# Patient Record
Sex: Female | Born: 1975 | Race: White | Hispanic: No | Marital: Single | Smoking: Never smoker
Health system: Southern US, Community
[De-identification: ages and names within clinical notes are randomized; demographics above are authoritative.]

## PROBLEM LIST (undated history)

## (undated) DIAGNOSIS — R51 Headache: Secondary | ICD-10-CM

## (undated) DIAGNOSIS — T7840XA Allergy, unspecified, initial encounter: Secondary | ICD-10-CM

## (undated) DIAGNOSIS — F419 Anxiety disorder, unspecified: Secondary | ICD-10-CM

## (undated) DIAGNOSIS — R519 Headache, unspecified: Secondary | ICD-10-CM

## (undated) DIAGNOSIS — G8929 Other chronic pain: Secondary | ICD-10-CM

## (undated) DIAGNOSIS — J45909 Unspecified asthma, uncomplicated: Secondary | ICD-10-CM

## (undated) DIAGNOSIS — I10 Essential (primary) hypertension: Secondary | ICD-10-CM

## (undated) DIAGNOSIS — R05 Cough: Secondary | ICD-10-CM

## (undated) HISTORY — DX: Other chronic pain: G89.29

## (undated) HISTORY — PX: LASIK: SHX215

## (undated) HISTORY — DX: Unspecified asthma, uncomplicated: J45.909

## (undated) HISTORY — PX: OTHER SURGICAL HISTORY: SHX169

## (undated) HISTORY — DX: Cough: R05

## (undated) HISTORY — PX: EYE SURGERY: SHX253

## (undated) HISTORY — DX: Allergy, unspecified, initial encounter: T78.40XA

## (undated) HISTORY — DX: Anxiety disorder, unspecified: F41.9

## (undated) HISTORY — DX: Headache, unspecified: R51.9

## (undated) HISTORY — DX: Headache: R51

## (undated) HISTORY — DX: Essential (primary) hypertension: I10

---

## 1997-12-04 HISTORY — PX: BREAST SURGERY: SHX581

## 2005-07-26 ENCOUNTER — Other Ambulatory Visit: Admission: RE | Admit: 2005-07-26 | Discharge: 2005-07-26 | Payer: Self-pay | Admitting: Obstetrics and Gynecology

## 2007-09-04 ENCOUNTER — Other Ambulatory Visit: Admission: RE | Admit: 2007-09-04 | Discharge: 2007-09-04 | Payer: Self-pay | Admitting: Gynecology

## 2008-06-19 ENCOUNTER — Inpatient Hospital Stay (HOSPITAL_COMMUNITY): Admission: AD | Admit: 2008-06-19 | Discharge: 2008-06-19 | Payer: Self-pay | Admitting: *Deleted

## 2008-06-22 ENCOUNTER — Inpatient Hospital Stay (HOSPITAL_COMMUNITY): Admission: AD | Admit: 2008-06-22 | Discharge: 2008-06-27 | Payer: Self-pay | Admitting: Obstetrics & Gynecology

## 2011-04-18 NOTE — H&P (Signed)
NAMETARAHJI, RAMTHUN NO.:  [id number]   MEDICAL RECORD NO.:  [id number]           PATIENT TYPE:   LOCATION:                                 FACILITY:   PHYSICIAN:  Lenoard Aden, M.D.     DATE OF BIRTH:   DATE OF ADMISSION:  DATE OF DISCHARGE:                              HISTORY & PHYSICAL   EMERGENCY ROOM CONSULTATION   CHIEF COMPLAINT:  Rule out preeclampsia.   She is a 35 year old white female, G1, P0 at 39+ weeks, who presents for  evaluation of elevated blood pressure and proteinuria in the office.  She denies headaches, visual symptoms, epigastric pain.  She reports  good fetal movement.  Denies contractions, bleeding, or ruptured  membranes.   MEDICATIONS:  Prenatal vitamins.   She has allergies to PENICILLIN.   FAMILY HISTORY:  Myocardial infarction, chronic hypertension.   PREGNANCY HISTORY:  Noncontributory.  Prenatal course complicated by  multiple uterine fibroids but no other problems.   PHYSICAL EXAMINATION:  She is a well-developed and well-nourished female  in no acute distress.  HEENT:  Normal.  LUNGS:  Clear.  HEART:  Regular rate and rhythm.  ABDOMEN:  Soft, gravid, nontender.  No right upper quadrant tenderness.  DTRs 2+.  No evidence of clonus.  Cervix long, closed, thick, and out at  the pelvis.   NST is reactive.  PIH panel, including CBC, CMP, uric acid, and LDH are  all within normal limits.   The ultrasound performed reveals an estimated fetal weight of greater in  the 80th percentile, an estimated amniotic fluid index in the 80th  percentile as well.  NST is reactive.  No contractions are noted.   IMPRESSION:  1. A 39-week obstetrics.  2. Stabilization of blood pressure with blood pressures now ranging      120s-130s/70s-80s with normal pregnancy-induced hypertension  panel      and reassuring ultrasound.   PLAN:  1. Discharge home.  2. Preeclamptic warnings given.  3. Patient to collect a 24-hour urine  sample over the weekend and      results to the office on Monday.      Lenoard Aden, M.D.  Electronically Signed     RJT/MEDQ  D:  06/19/2008  T:  06/19/2008  Job:  [id number]

## 2011-04-21 NOTE — Discharge Summary (Signed)
Tonya Holland, GIACOBBE       ACCOUNT NO.:  [id number]   MEDICAL RECORD NO.:  [id number]          PATIENT TYPE:  INP   LOCATION:  9138                          FACILITY:  [facility]   PHYSICIAN:  Genia Del, M.D.DATE OF BIRTH:  Jan 05, 1976   DATE OF ADMISSION:  06/22/2008  DATE OF DISCHARGE:  06/27/2008                               DISCHARGE SUMMARY   ADMISSION DIAGNOSES:  A 39 weeks' and 6 days' gestation with chronic  hypertension for induction.   DISCHARGE DIAGNOSES:  A 39 weeks' and 6 days' gestation with chronic  hypertension for induction.   HOSPITAL COURSE:  The patient was admitted, and Cervidil was used for  cervical ripening given an unfavorable cervix.  The patient had  spontaneous rupture of membranes that was prolonged.  She was therefore  started on IV antibiotics.  She progressed well in active labor, but had  insufficiency of expulsive forces in the second stage, so after more  than 2 hours of pushing, a low-vacuum extraction was done successfully.  She had a vaginal delivery, baby girl.  Apgars were 9 and 9.  No  complication from delivery.  Postpartum evaluation was normal.  The  patient was discharged in stable status on postpartum day #2.  Her  postpartum hemoglobin was 8.1 with a hematocrit of 24.3.  Postpartum  advice were given.  Iron supplements were advised.  She will follow up  with [facility] in 6 weeks.      Genia Del, M.D.  Electronically Signed     ML/MEDQ  D:  07/12/2008  T:  07/12/2008  Job:  [id number]

## 2011-09-01 LAB — CBC
HCT: 24.3 — ABNORMAL LOW
HCT: 37.5
HCT: 39
Hemoglobin: 12.5
MCV: 87.8
MCV: 87.9
MCV: 88.2
MCV: 89.5
Platelets: 190
Platelets: 253
RBC: 2.72 — ABNORMAL LOW
RBC: 4.26
RBC: 4.45
RDW: 16.1 — ABNORMAL HIGH
WBC: 11.8 — ABNORMAL HIGH
WBC: 12.2 — ABNORMAL HIGH
WBC: 13.5 — ABNORMAL HIGH
WBC: 16.4 — ABNORMAL HIGH

## 2011-09-01 LAB — URIC ACID
Uric Acid, Serum: 4.5
Uric Acid, Serum: 4.5

## 2011-09-01 LAB — COMPREHENSIVE METABOLIC PANEL
AST: 33
AST: 35
Albumin: 2.4 — ABNORMAL LOW
Albumin: 2.7 — ABNORMAL LOW
Alkaline Phosphatase: 131 — ABNORMAL HIGH
Alkaline Phosphatase: 152 — ABNORMAL HIGH
BUN: 5 — ABNORMAL LOW
BUN: 9
BUN: 9
CO2: 22
CO2: 23
Calcium: 9.4
Chloride: 103
Chloride: 105
Chloride: 105
Creatinine, Ser: 0.45
Creatinine, Ser: 0.46
Creatinine, Ser: 0.55
GFR calc Af Amer: >60
GFR calc Af Amer: >60
GFR calc non Af Amer: >60
GFR calc non Af Amer: >60
Potassium: 3.8
Potassium: 4
Total Bilirubin: 0.3
Total Bilirubin: 0.5
Total Bilirubin: 0.8
Total Protein: 6.5

## 2011-09-01 LAB — GLUCOSE, CAPILLARY
Glucose-Capillary: 105 — ABNORMAL HIGH
Glucose-Capillary: 128 — ABNORMAL HIGH
Glucose-Capillary: 89
Glucose-Capillary: 92

## 2011-12-02 ENCOUNTER — Ambulatory Visit (INDEPENDENT_AMBULATORY_CARE_PROVIDER_SITE_OTHER): Payer: BC Managed Care – PPO

## 2011-12-02 DIAGNOSIS — R059 Cough, unspecified: Secondary | ICD-10-CM

## 2011-12-02 DIAGNOSIS — R05 Cough: Secondary | ICD-10-CM

## 2011-12-02 DIAGNOSIS — J069 Acute upper respiratory infection, unspecified: Secondary | ICD-10-CM

## 2012-01-03 ENCOUNTER — Institutional Professional Consult (permissible substitution): Payer: Self-pay | Admitting: Internal Medicine

## 2012-01-11 ENCOUNTER — Ambulatory Visit (INDEPENDENT_AMBULATORY_CARE_PROVIDER_SITE_OTHER): Payer: BC Managed Care – PPO | Admitting: Internal Medicine

## 2012-01-11 ENCOUNTER — Encounter: Payer: Self-pay | Admitting: Internal Medicine

## 2012-01-11 DIAGNOSIS — I10 Essential (primary) hypertension: Secondary | ICD-10-CM

## 2012-01-11 DIAGNOSIS — R059 Cough, unspecified: Secondary | ICD-10-CM

## 2012-01-11 DIAGNOSIS — R05 Cough: Secondary | ICD-10-CM | POA: Insufficient documentation

## 2012-01-11 HISTORY — DX: Cough, unspecified: R05.9

## 2012-01-11 MED ORDER — OLMESARTAN MEDOXOMIL 20 MG PO TABS: ORAL_TABLET | ORAL | Status: DC

## 2012-01-11 NOTE — Assessment & Plan Note (Signed)
The most common causes of chronic cough in immunocompetent adults include the following: upper airway cough syndrome (UACS), previously referred to as postnasal drip syndrome (PNDS), which is caused by variety of rhinosinus conditions; (2) asthma; (3) GERD; (4) chronic bronchitis from cigarette smoking or other inhaled environmental irritants; (5) nonasthmatic eosinophilic bronchitis; and (6) bronchiectasis.   These conditions, singly or in combination, have accounted for up to 94% of the causes of chronic cough in prospective studies.   Other conditions have constituted no >6% of the causes in prospective studies These have included bronchogenic carcinoma, chronic interstitial pneumonia, sarcoidosis, left ventricular failure, ACEI-induced cough, and aspiration from a condition associated with pharyngeal dysfunction.   This cough is most c/w  Classic Upper airway cough syndrome, so named because it's frequently impossible to sort out how much is  CR/sinusitis with freq throat clearing (which can be related to primary GERD)   vs  causing  secondary (" extra esophageal")  GERD from wide swings in gastric pressure that occur with throat clearing, often  promoting self use of mint and menthol lozenges that reduce the lower esophageal sphincter tone and exacerbate the problem further in a cyclical fashion.   These are the same pts who not infrequently have failed to tolerate ace inhibitors,  dry powder inhalers or biphosphonates or report having reflux symptoms that don't respond to standard doses of PPI , and are easily confused as having aecopd or asthma flares,   She is on ACE inhibitors so the lowest hanging fruit is to stop this then look at other issues like cough variant asthma and sinus dz/ gerd etc.

## 2012-01-11 NOTE — Patient Instructions (Addendum)
GERD (REFLUX)  is an extremely common cause of respiratory symptoms, many times with no significant heartburn at all, and may be triggering some of your symptoms while on lotensin since reflux can set off upper airway inflammation   It can be treated with medication, but also with lifestyle changes including avoidance of late meals, excessive alcohol, smoking cessation, and avoid fatty foods, chocolate, peppermint, colas, red wine, and acidic juices such as orange juice.  NO MINT OR MENTHOL PRODUCTS SO NO COUGH DROPS  USE SUGARLESS CANDY INSTEAD (jolley ranchers or Stover's)  NO OIL BASED VITAMINS - use powdered substitutes.   Stop lotensin  Start benicar 20 mg one half each am in place of lotensin  See Dr  Hal Hope in 6 weeks if better and she can manage your blood pressure - if not better please return at that point to see me

## 2012-01-11 NOTE — Assessment & Plan Note (Signed)
ACE inhibitors are problematic in  pts with airway complaints because  even experienced pulmonologists can't always distinguish ace effects from copd/asthma/pnds/ allergies etc.  By themselves they don't actually cause a problem, much like oxygen can't by itself start a fire, but they certainly serve as a powerful catalyst or enhancer for any "fire"  or inflammatory process in the upper airway, be it caused by an ET  tube or more commonly reflux (especially in the obese or pts with known GERD or who are on biphoshonates) or URI's, due to interference with bradykinin clearance.  The effects of acei on bradykinin levels occurs in 100% of pt's on acei (unless they surreptitiously stop the med!) but the classic cough is only reported in 5%.  This leaves 95% of pts on acei's  with a variety of syndromes including no identifiable symptom in most  vs non-specific symptoms in many that wax and wane depending on what other insult is occuring at the level of the upper airway.     The only way to establish cause and effect here is a minimum of 4 week trial off ace and consider substituting longterm arb if tolerated .  For now samples given of benicar 20 mg one half daily > See instructions for specific recommendations which were reviewed directly with the patient who was given a copy with highlighter outlining the key components.

## 2012-01-11 NOTE — Progress Notes (Signed)
  Subjective:    Patient ID: Tonya Holland, female    DOB: 08/04/1976  MRN: [id number]  HPI  Brief patient profile:  36 yowf never smoker dx'd with asthma as child outgrew in elementary school but continued to have some seasonal rhinitis with recurrent bronchitis since around 2010 while on ACE inhibitors self referred 01/11/2012 to pulmonary clinic.  HPI 01/11/2012  1st pulmonary eval on ACEI  X 3 years c/o increase in severity and  Frequency  Of exac of bronchitis with constant sensation of pnds in between flarees  despite clariton daily. No change in exposures at home or work. Better with rx with prednisone best. No sob between exac and presently not using saba much at all during the day. Her main concern is preventing future severe exac of cough.  Presently Sleeping ok without nocturnal  or early am exacerbation  of respiratory  c/o's or need for noct saba. Also denies any obvious fluctuation of symptoms with weather or environmental changes or other aggravating or alleviating factors except as outlined above        Review of Systems  Constitutional: Negative for fever and unexpected weight change.  HENT: Positive for congestion. Negative for ear pain, nosebleeds, sore throat, rhinorrhea, sneezing, trouble swallowing, dental problem, postnasal drip and sinus pressure.   Eyes: Negative for redness and itching.  Respiratory: Positive for cough. Negative for chest tightness, shortness of breath and wheezing.   Cardiovascular: Negative for palpitations and leg swelling.  Gastrointestinal: Negative for nausea and vomiting.  Genitourinary: Negative for dysuria.  Musculoskeletal: Negative for joint swelling.  Skin: Negative for rash.  Neurological: Negative for headaches.  Hematological: Does not bruise/bleed easily.  Psychiatric/Behavioral: Negative for dysphoric mood. The patient is nervous/anxious.        Objective:   Physical Exam  amb wf nad with pseudowheeze  amb wf 219   01/11/2012   HEENT: nl dentition, turbinates, and orophanx. Nl external ear canals without cough reflex   NECK :  without JVD/Nodes/TM/ nl carotid upstrokes bilaterally   LUNGS: no acc muscle use, clear to A and P bilaterally without cough on insp or exp maneuvers   CV:  RRR  no s3 or murmur or increase in P2, no edema   ABD:  soft and nontender with nl excursion in the supine position. No bruits or organomegaly, bowel sounds nl  MS:  warm without deformities, calf tenderness, cyanosis or clubbing  SKIN: warm and dry without lesions    NEURO:  alert, approp, no deficits        Assessment & Plan:

## 2012-02-19 ENCOUNTER — Telehealth: Payer: Self-pay | Admitting: Internal Medicine

## 2012-02-19 MED ORDER — CITALOPRAM HYDROBROMIDE 10 MG PO TABS: 10.0000 mg | ORAL_TABLET | Freq: Every day | ORAL | Status: DC

## 2012-02-19 NOTE — Telephone Encounter (Signed)
Celexa RF #[phone]

## 2012-02-26 ENCOUNTER — Encounter: Payer: Self-pay | Admitting: Family Medicine

## 2012-02-26 ENCOUNTER — Ambulatory Visit (INDEPENDENT_AMBULATORY_CARE_PROVIDER_SITE_OTHER): Payer: BC Managed Care – PPO | Admitting: Family Medicine

## 2012-02-26 VITALS — BP 136/89 | HR 88 | Temp 97.3°F | Resp 16 | Ht 68.0 in | Wt 206.0 lb

## 2012-02-26 DIAGNOSIS — I10 Essential (primary) hypertension: Secondary | ICD-10-CM

## 2012-02-26 DIAGNOSIS — F419 Anxiety disorder, unspecified: Secondary | ICD-10-CM

## 2012-02-26 NOTE — Progress Notes (Signed)
  Subjective:    Patient ID: Tonya Holland, female    DOB: [birth year], 36 y.o.   MRN: [id number]  HPI Patient presents for routine follow up. Urgent visits secondary to cough followed by pulmonary evaluation. Ace inhibitor discontinued with resolution of cough. Currently on Benicar without side effects..  Needs refills of Celexa and Xanax; states anxiety symptoms controlled on current regimen. Denies stressors.  Review of Systems     Objective:   Physical Exam  Constitutional: She appears well-developed.  HENT:  Nose: Nose normal.  Mouth/Throat: Oropharynx is clear and moist.  Neck: Neck supple.  Cardiovascular: Normal rate, regular rhythm and normal heart sounds.   Pulmonary/Chest: Effort normal and breath sounds normal.  Neurological: She is alert.        Assessment & Plan:   1. Anxiety  citalopram (CELEXA) 10 MG tablet, ALPRAZolam (XANAX) 0.5 MG tablet  2. HTN (hypertension)  Benicar 20mg  daily    Follow up 6 months

## 2012-02-28 ENCOUNTER — Telehealth: Payer: Self-pay

## 2012-02-28 NOTE — Telephone Encounter (Signed)
Pt is requesting med refill on selexia, benicar , xanax and naproxine [facility] [address]

## 2012-02-29 ENCOUNTER — Telehealth: Payer: Self-pay

## 2012-02-29 MED ORDER — CITALOPRAM HYDROBROMIDE 10 MG PO TABS: 10.0000 mg | ORAL_TABLET | Freq: Every day | ORAL | Status: DC

## 2012-02-29 MED ORDER — ALPRAZOLAM 0.5 MG PO TABS: 0.5000 mg | ORAL_TABLET | Freq: Every evening | ORAL | Status: DC | PRN

## 2012-02-29 NOTE — Telephone Encounter (Signed)
This patient was seen on 02/26/12. Needs refills on meds. Please advise.

## 2012-02-29 NOTE — Telephone Encounter (Signed)
Pharmacist would like a refill for Naproxen on file for pt. Can we refill? Please advise

## 2012-02-29 NOTE — Telephone Encounter (Signed)
Patient needs to call her pharmacy to request all refills.  Tonya Holland

## 2012-03-01 ENCOUNTER — Telehealth: Payer: Self-pay

## 2012-03-01 DIAGNOSIS — I10 Essential (primary) hypertension: Secondary | ICD-10-CM

## 2012-03-01 DIAGNOSIS — F411 Generalized anxiety disorder: Secondary | ICD-10-CM | POA: Insufficient documentation

## 2012-03-01 MED ORDER — LOSARTAN POTASSIUM 50 MG PO TABS: 50.0000 mg | ORAL_TABLET | Freq: Every day | ORAL | Status: DC

## 2012-03-01 MED ORDER — NAPROXEN 500 MG PO TABS: 500.0000 mg | ORAL_TABLET | ORAL | Status: DC | PRN

## 2012-03-01 NOTE — Telephone Encounter (Signed)
Losartan sent.

## 2012-03-01 NOTE — Telephone Encounter (Signed)
Spoke with pharmacist yesterday and I called in refills for pt. He just wanted RX on file for Naproxen.

## 2012-03-01 NOTE — Telephone Encounter (Deleted)
Patient called and is having problems getting her Benicar due to insurance prior authorization. Patient is frustrated because she has been trying to get this resolved Monday since her office.  After discussion we came up with a plan that we would continue with the prior auth and call in 30 days of Losartan 50mg  in the meantime. Patient was advised that Losartan is the generic Benicar and that insurance companies will not typically pay for name brand until the generic has failed. Patient is willing to try the generic and if the generic works she will stay with it. Will proceed with the insurance paper work.  Please send Losartan 50mg  to [facility]

## 2012-03-01 NOTE — Telephone Encounter (Signed)
Already done.  See phone message from the patient.

## 2012-03-01 NOTE — Telephone Encounter (Signed)
Patient called to stated that she has been trying to get her Benicar prior auth approval taken care of since her office visit since Monday. Spoke with pharmacy and requested they resend paperwork for prior auth.  Advised patient that Benicar may not be approved because the generic (Losartan) would need to be tried and failed. Patient agreed on plan to try Losartan 50 mg for 30 days and still try to get Benicar approved.  Please send Losartan 50 mg to [facility].

## 2012-03-01 NOTE — Telephone Encounter (Signed)
Please advise patient that her prescriptions for celexa, benicar and alprazolam all have refills on them and she just needs to notify the pharmacist.  I have sent a refill of the naproxen to the pharmacy.

## 2012-03-08 ENCOUNTER — Telehealth: Payer: Self-pay

## 2012-03-08 NOTE — Telephone Encounter (Signed)
Dr Hal Hope for your information please see previous communication concerning medication. Patient returned call and will follow up with Dr Hal Hope in 1 month to see if blood pressure is at good levels on losartan 50 mg. If that is the case and Dr Hal Hope agrees she will stay on losartan.

## 2012-03-08 NOTE — Telephone Encounter (Signed)
[facility], they returned my call and stated that the Benicar does not need a prior auth it just has a high deductible of $104 per 30 day supply. Patient needs to be notified.

## 2012-03-08 NOTE — Telephone Encounter (Signed)
Left message on voicemail for return call.

## 2012-03-29 ENCOUNTER — Other Ambulatory Visit: Payer: Self-pay | Admitting: Physician Assistant

## 2012-04-08 ENCOUNTER — Ambulatory Visit (INDEPENDENT_AMBULATORY_CARE_PROVIDER_SITE_OTHER): Payer: BC Managed Care – PPO | Admitting: Family Medicine

## 2012-04-08 ENCOUNTER — Encounter: Payer: Self-pay | Admitting: Family Medicine

## 2012-04-08 VITALS — BP 128/78 | HR 91 | Temp 98.4°F | Resp 16 | Ht 68.5 in | Wt 197.4 lb

## 2012-04-08 DIAGNOSIS — F411 Generalized anxiety disorder: Secondary | ICD-10-CM

## 2012-04-08 DIAGNOSIS — F419 Anxiety disorder, unspecified: Secondary | ICD-10-CM

## 2012-04-08 DIAGNOSIS — I10 Essential (primary) hypertension: Secondary | ICD-10-CM

## 2012-04-08 MED ORDER — CITALOPRAM HYDROBROMIDE 10 MG PO TABS: 10.0000 mg | ORAL_TABLET | Freq: Every day | ORAL | Status: DC

## 2012-04-08 MED ORDER — LOSARTAN POTASSIUM 50 MG PO TABS: 50.0000 mg | ORAL_TABLET | Freq: Every day | ORAL | Status: DC

## 2012-04-08 NOTE — Progress Notes (Signed)
  Subjective:    Patient ID: Tonya Holland, female    DOB: Feb 08, 1976, 36 y.o.   MRN: [id number]  HPI Patient presents for recheck of blood pressure. Insurance would not continue to cover Benicar so patient switched to Cozaar  Occasional BP 140/90  Review of Systems     Objective:   Physical Exam  Nursing note and vitals reviewed. Constitutional: She appears well-developed.  Cardiovascular: Normal rate, regular rhythm and normal heart sounds.   Pulmonary/Chest: Effort normal and breath sounds normal.  Neurological: She is alert.        Assessment & Plan:  HTN  Follow BP readings.  Patient to call is BP's are consistently 140/ 90 or greater and we'll adjust dose

## 2012-04-10 ENCOUNTER — Encounter: Payer: Self-pay | Admitting: Family Medicine

## 2012-04-22 ENCOUNTER — Ambulatory Visit: Payer: BC Managed Care – PPO | Admitting: Family Medicine

## 2012-04-30 ENCOUNTER — Other Ambulatory Visit: Payer: Self-pay | Admitting: Physician Assistant

## 2012-06-04 ENCOUNTER — Encounter: Payer: Self-pay | Admitting: Family Medicine

## 2012-06-04 ENCOUNTER — Ambulatory Visit (INDEPENDENT_AMBULATORY_CARE_PROVIDER_SITE_OTHER): Payer: BC Managed Care – PPO | Admitting: Family Medicine

## 2012-06-04 VITALS — BP 128/81 | HR 83 | Temp 97.9°F | Resp 18 | Ht 68.5 in | Wt 184.2 lb

## 2012-06-04 DIAGNOSIS — I10 Essential (primary) hypertension: Secondary | ICD-10-CM

## 2012-06-04 DIAGNOSIS — F419 Anxiety disorder, unspecified: Secondary | ICD-10-CM

## 2012-06-04 MED ORDER — FLUTICASONE PROPIONATE 50 MCG/ACT NA SUSP: 2.0000 | NASAL | Status: DC | PRN

## 2012-06-04 MED ORDER — ALPRAZOLAM 0.5 MG PO TABS: ORAL_TABLET | ORAL | Status: DC

## 2012-06-04 MED ORDER — NAPROXEN 500 MG PO TABS: 500.0000 mg | ORAL_TABLET | ORAL | Status: DC | PRN

## 2012-06-04 NOTE — Progress Notes (Signed)
S: This 36 y.o. Cauc female is here for HTN follow-up and medication refills. She has had no problems since BP med was changed to Losartan. Denies HA (except for occasional migraine treated with Naprosyn), CP, SOB, cough, dizziness,       edema, lightheadedness, syncope, or weakness.  She takes Alprazolam prn anxiety (does not need it daily). She is doing well on Citalopram.  O: Vital signs reviewed      HEENT: Normal      COR: Regular rate and rhythm      LUNGS: Normal resp rate and effort      NEURO: Nonfocal   A: HTN- well controlled and stable on current medication   P: Refill needed medications and schedule CPE (PAP done by GYN provider); labs to be drawn at next visit

## 2012-07-19 ENCOUNTER — Ambulatory Visit (INDEPENDENT_AMBULATORY_CARE_PROVIDER_SITE_OTHER): Payer: BC Managed Care – PPO | Admitting: Family Medicine

## 2012-07-19 VITALS — BP 130/84 | HR 84 | Temp 98.1°F | Resp 16 | Ht 68.5 in | Wt 176.0 lb

## 2012-07-19 DIAGNOSIS — R350 Frequency of micturition: Secondary | ICD-10-CM

## 2012-07-19 LAB — POCT URINALYSIS DIPSTICK
Bilirubin, UA: NEGATIVE
Glucose, UA: NEGATIVE
Ketones, UA: NEGATIVE
Nitrite, UA: POSITIVE
Protein, UA: 100
Spec Grav, UA: >=1.03
Urobilinogen, UA: 0.2
pH, UA: 6

## 2012-07-19 LAB — POCT UA - MICROSCOPIC ONLY
Casts, Ur, LPF, POC: NEGATIVE
Crystals, Ur, HPF, POC: NEGATIVE
Mucus, UA: NEGATIVE
Yeast, UA: NEGATIVE

## 2012-07-19 MED ORDER — FLUCONAZOLE 150 MG PO TABS: 150.0000 mg | ORAL_TABLET | Freq: Once | ORAL | Status: AC

## 2012-07-19 MED ORDER — NITROFURANTOIN MONOHYD MACRO 100 MG PO CAPS: 100.0000 mg | ORAL_CAPSULE | Freq: Two times a day (BID) | ORAL | Status: AC

## 2012-07-19 NOTE — Progress Notes (Signed)
[facility] [address], [place] [address] [phone]  Date:  07/19/2012   Name:  Tonya Holland   DOB:  [birth year]   MRN:  [id number]  PCP:  Dois Davenport., MD    Chief Complaint: Urinary Frequency   History of Present Illness:  Tonya Holland is a 36 y.o. very pleasant female patient who presents with the following:  She noted onset of dysuria, urgency yesterday.   She is not sure if she has had hematuria.  No fever or vomiting.  She states there is no risk of pregnancy at this time. No vaginal symptoms.  These are typical UTI symptoms for her.    Patient Active Problem List  Diagnosis  . Cough  . Hypertension  . Anxiety    Past Medical History  Diagnosis Date  . Hypertension   . Chronic headache     Past Surgical History  Procedure Date  . Breast tumor removed at age 74    right  . Lasik at age 89     History  Substance Use Topics  . Smoking status: Never Smoker   . Smokeless tobacco: Not on file  . Alcohol Use: Yes     occasionally wine    Family History  Problem Relation Age of Onset  . Allergies Mother   . Asthma Mother   . Breast cancer Mother     Allergies  Allergen Reactions  . Ace Inhibitors   . Penicillins     childhood    Medication list has been reviewed and updated.  Current Outpatient Prescriptions on File Prior to Visit  Medication Sig Dispense Refill  . ALPRAZolam (XANAX) 0.5 MG tablet Take 1/2 - 1 tablet twice a day as needed.  45 tablet  5  . cetirizine (ZYRTEC) 10 MG tablet Take 10 mg by mouth as needed.      . citalopram (CELEXA) 10 MG tablet Take 1 tablet (10 mg total) by mouth daily.  90 tablet  2  . fluticasone (FLONASE) 50 MCG/ACT nasal spray Place 2 sprays into the nose as needed.  16 g  5  . losartan (COZAAR) 50 MG tablet Take 1 tablet (50 mg total) by mouth daily.  90 tablet  2  . Melatonin-Pyridoxine (MELATIN PO) Take by mouth daily.      . naproxen (NAPROSYN) 500 MG  tablet Take 1 tablet (500 mg total) by mouth as needed.  60 tablet  2  . phentermine 37.5 MG capsule Take 37.5 mg by mouth daily.      . vitamin C (ASCORBIC ACID) 500 MG tablet Take 500 mg by mouth as needed.        Review of Systems:  As per HPI- otherwise negative.   Physical Examination: Filed Vitals:   07/19/12 1613  BP: 130/84  Pulse: 84  Temp: 98.1 F (36.7 C)  Resp: 16   Filed Vitals:   07/19/12 1613  Height: 5' 8.5" (1.74 m)  Weight: 176 lb (79.833 kg)   Body mass index is 26.37 kg/(m^2). Ideal Body Weight: Weight in (lb) to have BMI = 25: 166.5   GEN: WDWN, NAD, Non-toxic, A & O x 3 HEENT: Atraumatic, Normocephalic. Neck supple. No masses, No LAD. Ears and Nose: No external deformity. CV: RRR, No M/G/R. No JVD. No thrill. No extra heart sounds. PULM: CTA B, no wheezes, crackles, rhonchi. No retractions. No resp. distress. No accessory muscle use. ABD: S, NT, ND, +BS. No rebound. No  HSM. No CVA tenderness EXTR: No c/c/e NEURO Normal gait.  PSYCH: Normally interactive. Conversant. Not depressed or anxious appearing.  Calm demeanor.   Results for orders placed in visit on 07/19/12  POCT URINALYSIS DIPSTICK      Component Value Range   Color, UA yellow     Clarity, UA turbid     Glucose, UA neg     Bilirubin, UA neg     Ketones, UA neg     Spec Grav, UA >=1.030     Blood, UA large     pH, UA 6.0     Protein, UA 100 mg/dL     Urobilinogen, UA 0.2     Nitrite, UA positive     Leukocytes, UA large (3+)    POCT UA - MICROSCOPIC ONLY      Component Value Range   WBC, Ur, HPF, POC tntc     RBC, urine, microscopic 0-1     Bacteria, U Microscopic 4++     Mucus, UA neg     Epithelial cells, urine per micros 3-6     Crystals, Ur, HPF, POC neg     Casts, Ur, LPF, POC neg     Yeast, UA neg       Assessment and Plan: 1. Urinary frequency  POCT urinalysis dipstick, POCT UA - Microscopic Only, nitrofurantoin, macrocrystal-monohydrate, (MACROBID) 100 MG capsule,  fluconazole (DIFLUCAN) 150 MG tablet, Urine culture   Treat as above for UTI.  Patient (or parent if minor) instructed to return to clinic or call if not better in 2 day(s). May use diflucan if yeast vagintis occurs, but should not as she is taking macrobid.   Abbe Amsterdam, MD

## 2012-07-22 LAB — URINE CULTURE: Colony Count: >=100000

## 2012-08-29 ENCOUNTER — Other Ambulatory Visit: Payer: Self-pay | Admitting: Radiology

## 2012-08-29 DIAGNOSIS — F419 Anxiety disorder, unspecified: Secondary | ICD-10-CM

## 2012-08-29 MED ORDER — ALPRAZOLAM 0.5 MG PO TABS: ORAL_TABLET | ORAL | Status: DC

## 2012-09-17 ENCOUNTER — Encounter: Payer: BC Managed Care – PPO | Admitting: Family Medicine

## 2012-12-04 ENCOUNTER — Ambulatory Visit: Payer: BC Managed Care – PPO | Admitting: Family Medicine

## 2012-12-04 VITALS — BP 110/72 | HR 88 | Temp 97.6°F | Resp 16 | Ht 68.5 in | Wt 157.8 lb

## 2012-12-04 DIAGNOSIS — R05 Cough: Secondary | ICD-10-CM

## 2012-12-04 DIAGNOSIS — R509 Fever, unspecified: Secondary | ICD-10-CM

## 2012-12-04 LAB — POCT INFLUENZA A/B
Influenza A, POC: NEGATIVE
Influenza B, POC: NEGATIVE

## 2012-12-04 MED ORDER — DOXYCYCLINE HYCLATE 100 MG PO TABS: 100.0000 mg | ORAL_TABLET | Freq: Two times a day (BID) | ORAL | Status: DC

## 2012-12-04 NOTE — Progress Notes (Signed)
[facility] [address] [place] [address] [phone]  Date:  12/04/2012   Name:  Tonya Holland   DOB:  13-Jun-1976   MRN:  [id number]  PCP:  Dois Davenport., MD    Chief Complaint: Fever, Cough and Sinusitis   History of Present Illness:  Tonya Holland is a 37 y.o. very pleasant female patient who presents with the following:  She is here today with illness- she has noted a fever since 11/28/12.  She has been blowing out thick discolored mucus, also coughing a lot at night- can be productive.  She is using mucinex BID, sudafed and robitussin.  No particular ST or otits She is also using tylneol as needed, her temp has gotten up to 101- it was this high again this am.  She is having body aches.  She does feel tired and is having chills.   She is otherwise generally healthy except to HTN and anxiety- BP well controlled LMP was last week  Patient Active Problem List  Diagnosis  . Cough  . Hypertension  . Anxiety    Past Medical History  Diagnosis Date  . Hypertension   . Chronic headache     Past Surgical History  Procedure Date  . Breast tumor removed at age 51    right  . Lasik at age 63     History  Substance Use Topics  . Smoking status: Never Smoker   . Smokeless tobacco: Not on file  . Alcohol Use: Yes     Comment: occasionally wine    Family History  Problem Relation Age of Onset  . Allergies Mother   . Asthma Mother   . Breast cancer Mother     Allergies  Allergen Reactions  . Ace Inhibitors   . Penicillins     childhood    Medication list has been reviewed and updated.  Current Outpatient Prescriptions on File Prior to Visit  Medication Sig Dispense Refill  . ALPRAZolam (XANAX) 0.5 MG tablet Take one tablet at bedtime prn  45 tablet  0  . citalopram (CELEXA) 10 MG tablet Take 1 tablet (10 mg total) by mouth daily.  90 tablet  2  . fluticasone (FLONASE) 50 MCG/ACT nasal spray Place 2 sprays  into the nose as needed.  16 g  5  . losartan (COZAAR) 50 MG tablet Take 1 tablet (50 mg total) by mouth daily.  90 tablet  2  . Melatonin-Pyridoxine (MELATIN PO) Take by mouth daily.      . phentermine 37.5 MG capsule Take 37.5 mg by mouth daily.      . vitamin C (ASCORBIC ACID) 500 MG tablet Take 500 mg by mouth as needed.      . cetirizine (ZYRTEC) 10 MG tablet Take 10 mg by mouth as needed.      . naproxen (NAPROSYN) 500 MG tablet Take 1 tablet (500 mg total) by mouth as needed.  60 tablet  2    Review of Systems:  As per HPI- otherwise negative.   Physical Examination: Filed Vitals:   12/04/12 1303  BP: 110/72  Pulse: 88  Temp: 97.6 F (36.4 C)  Resp: 16   Filed Vitals:   12/04/12 1303  Height: 5' 8.5" (1.74 m)  Weight: 157 lb 12.8 oz (71.578 kg)   Body mass index is 23.64 kg/(m^2). Ideal Body Weight: Weight in (lb) to have BMI = 25: 166.5   GEN: WDWN, NAD, Non-toxic, A &  O x 3- looks well HEENT: Atraumatic, Normocephalic. Neck supple. No masses, No LAD. Bilateral TM wnl, oropharynx normal.  PEERL,EOMI.   Nasal congeston Ears and Nose: No external deformity. CV: RRR, No M/G/R. No JVD. No thrill. No extra heart sounds. PULM: CTA B, no wheezes, rhonchi. No retractions. No resp. distress. No accessory muscle use.  Mild crackles bilaterally ABD: S, NT, ND, +BS. No rebound. No HSM. EXTR: No c/c/e NEURO Normal gait.  PSYCH: Normally interactive. Conversant. Not depressed or anxious appearing.  Calm demeanor.    Assessment and Plan: 1. Fever  POCT Influenza A/B  2. Cough  doxycycline (VIBRA-TABS) 100 MG tablet   Treat for bronchitis and sinusitis as above. Continue OTC medications for symptoms as needed.  Let me know if not better in the next couple of days- Sooner if worse.     Abbe Amsterdam, MD

## 2012-12-04 NOTE — Patient Instructions (Addendum)
Use the antibiotic as directed.  Let me know if you are not getting better- Sooner if worse.

## 2012-12-11 ENCOUNTER — Encounter: Payer: Self-pay | Admitting: Family Medicine

## 2012-12-11 ENCOUNTER — Ambulatory Visit: Payer: BC Managed Care – PPO | Admitting: Family Medicine

## 2012-12-11 VITALS — BP 128/84 | HR 96 | Temp 98.4°F | Resp 16 | Ht 69.0 in | Wt 159.0 lb

## 2012-12-11 DIAGNOSIS — N898 Other specified noninflammatory disorders of vagina: Secondary | ICD-10-CM

## 2012-12-11 DIAGNOSIS — N920 Excessive and frequent menstruation with regular cycle: Secondary | ICD-10-CM

## 2012-12-11 LAB — POCT UA - MICROSCOPIC ONLY
Casts, Ur, LPF, POC: NEGATIVE
Yeast, UA: NEGATIVE

## 2012-12-11 LAB — POCT WET PREP WITH KOH
KOH Prep POC: NEGATIVE
Yeast Wet Prep HPF POC: NEGATIVE

## 2012-12-11 LAB — POCT URINALYSIS DIPSTICK
Blood, UA: NEGATIVE
Glucose, UA: NEGATIVE
Nitrite, UA: NEGATIVE
Urobilinogen, UA: 0.2

## 2012-12-11 MED ORDER — METRONIDAZOLE 500 MG PO TABS: 500.0000 mg | ORAL_TABLET | Freq: Two times a day (BID) | ORAL | Status: DC

## 2012-12-11 NOTE — Progress Notes (Signed)
[facility] [address], [place] [address] [phone]  Date:  12/11/2012   Name:  SRAH AKE   DOB:  [birth year]   MRN:  [id number]  PCP:  Dois Davenport., MD    Chief Complaint: Menstrual Problem   History of Present Illness:  KATERRA INGMAN is a 37 y.o. very pleasant female patient who presents with the following:  She was here on the 1st of the month with bronchitis. Treated with doxycycline.   She is here today with a menstrual problem.   She started her menses as expected on 11/27/12.  She has PCOS so her menses are not totally regular but are usually about every 4 weeks and usually last for 7 days .   She does not use OCP.  She has a female partner so there is no risk of pregnancy.  She did start to notice an odor a few days ago.  She does use tampons.  She is not sure if there could be a retained tampon.    No pain, she does not have a fever or any other sign of illness except she is still getting over her bronchitis.   No urinary symptoms.   She has not noted any vaginal discharge, no white material, no itching.    Patient Active Problem List  Diagnosis  . Cough  . Hypertension  . Anxiety    Past Medical History  Diagnosis Date  . Hypertension   . Chronic headache     Past Surgical History  Procedure Date  . Breast tumor removed at age 50    right  . Lasik at age 13     History  Substance Use Topics  . Smoking status: Never Smoker   . Smokeless tobacco: Not on file  . Alcohol Use: Yes     Comment: occasionally wine    Family History  Problem Relation Age of Onset  . Allergies Mother   . Asthma Mother   . Breast cancer Mother     Allergies  Allergen Reactions  . Ace Inhibitors   . Penicillins     childhood    Medication list has been reviewed and updated.  Current Outpatient Prescriptions on File Prior to Visit  Medication Sig Dispense Refill  . ALPRAZolam (XANAX) 0.5 MG tablet Take one  tablet at bedtime prn  45 tablet  0  . cetirizine (ZYRTEC) 10 MG tablet Take 10 mg by mouth as needed.      . citalopram (CELEXA) 10 MG tablet Take 1 tablet (10 mg total) by mouth daily.  90 tablet  2  . doxycycline (VIBRA-TABS) 100 MG tablet Take 1 tablet (100 mg total) by mouth 2 (two) times daily.  20 tablet  0  . fluticasone (FLONASE) 50 MCG/ACT nasal spray Place 2 sprays into the nose as needed.  16 g  5  . losartan (COZAAR) 50 MG tablet Take 1 tablet (50 mg total) by mouth daily.  90 tablet  2  . Melatonin-Pyridoxine (MELATIN PO) Take by mouth daily.      . naproxen (NAPROSYN) 500 MG tablet Take 1 tablet (500 mg total) by mouth as needed.  60 tablet  2  . phentermine 37.5 MG capsule Take 37.5 mg by mouth daily.      . vitamin C (ASCORBIC ACID) 500 MG tablet Take 500 mg by mouth as needed.        Review of Systems:  As per HPI- otherwise negative.  Physical Examination: Filed Vitals:   12/11/12 0812  BP: 128/84  Pulse: 96  Temp: 98.4 F (36.9 C)  Resp: 16   Filed Vitals:   12/11/12 0812  Height: 5\' 9"  (1.753 m)  Weight: 159 lb (72.122 kg)   Body mass index is 23.48 kg/(m^2). Ideal Body Weight: Weight in (lb) to have BMI = 25: 168.9   GEN: WDWN, NAD, Non-toxic, A & O x 3, looks well HEENT: Atraumatic, Normocephalic. Neck supple. No masses, No LAD. Ears and Nose: No external deformity. CV: RRR, No M/G/R. No JVD. No thrill. No extra heart sounds. PULM: CTA B, no wheezes, crackles, rhonchi. No retractions. No resp. distress. No accessory muscle use. ABD: S, NT, ND, +BS. No rebound. No HSM. EXTR: No c/c/e NEURO Normal gait.  PSYCH: Normally interactive. Conversant. Not depressed or anxious appearing.  Calm demeanor.  GU: no retained FB (tampon) in vagina, scant dark brown discharge but no active bleeding.  No adnexal tenderness or masses, no uterine enlargement or masses  Results for orders placed in visit on 12/11/12  POCT UA - MICROSCOPIC ONLY      Component Value  Range   WBC, Ur, HPF, POC 3-4     RBC, urine, microscopic 1-3     Bacteria, U Microscopic small     Mucus, UA small     Epithelial cells, urine per micros 5-8     Crystals, Ur, HPF, POC neg     Casts, Ur, LPF, POC neg     Yeast, UA neg    POCT URINALYSIS DIPSTICK      Component Value Range   Color, UA yellow     Clarity, UA hazy     Glucose, UA neg     Bilirubin, UA neg     Ketones, UA neg     Spec Grav, UA >=1.030     Blood, UA neg     pH, UA 6.5     Protein, UA neg     Urobilinogen, UA 0.2     Nitrite, UA neg     Leukocytes, UA Negative    POCT WET PREP WITH KOH      Component Value Range   Trichomonas, UA Negative     Clue Cells Wet Prep HPF POC 3-5     Epithelial Wet Prep HPF POC 2-4     Yeast Wet Prep HPF POC neg     Bacteria Wet Prep HPF POC 2+     RBC Wet Prep HPF POC 2-3     WBC Wet Prep HPF POC 8-12     KOH Prep POC Negative    POCT URINE PREGNANCY      Component Value Range   Preg Test, Ur Negative      Assessment and Plan: 1. Menorrhagia  POCT UA - Microscopic Only, POCT urinalysis dipstick, POCT Wet Prep with KOH, POCT urine pregnancy  2. Vaginal odor  metroNIDAZOLE (FLAGYL) 500 MG tablet   Suspect that Nicie has BV today.  Will treat with flagyl.  Explained that abnormal menstrual bleeding can occur due to stress or medication use.   It seems that her bleeding may be stopping now.  If she continues to note bleeding over the next couple of days we can try a course of provera or OCP.  Warned that if she continues to have this problem we will need her to see OB for possible endometrial sampling.  However, if her bleeding is soon under control and then returns  to normal further invasive evaluation should not be necessary.  She will keep me updated regarding her condition  COPLAND,JESSICA, MD

## 2012-12-11 NOTE — Patient Instructions (Addendum)
It looks like you have bacterial vaginosis.  Use the flagyl for this infection, but let me know if you bleeding is not resolved.  If you continue to have abnormal bleeding we will need to have you see OB- GYN

## 2012-12-12 ENCOUNTER — Telehealth: Payer: Self-pay | Admitting: *Deleted

## 2012-12-12 DIAGNOSIS — F419 Anxiety disorder, unspecified: Secondary | ICD-10-CM

## 2012-12-12 MED ORDER — CITALOPRAM HYDROBROMIDE 10 MG PO TABS: 10.0000 mg | ORAL_TABLET | Freq: Every day | ORAL | Status: DC

## 2012-12-12 NOTE — Telephone Encounter (Signed)
Celexa sent to pharmacy, pt needs OV/CPE

## 2012-12-12 NOTE — Telephone Encounter (Signed)
[facility]

## 2012-12-12 NOTE — Telephone Encounter (Signed)
LMOM we refilled her med.

## 2012-12-22 ENCOUNTER — Telehealth: Payer: Self-pay

## 2012-12-22 DIAGNOSIS — F419 Anxiety disorder, unspecified: Secondary | ICD-10-CM

## 2012-12-22 MED ORDER — ALPRAZOLAM 0.5 MG PO TABS: ORAL_TABLET | ORAL | Status: DC

## 2012-12-22 NOTE — Telephone Encounter (Signed)
Medication request (Alprazolam) called to [facility] on [address]

## 2012-12-22 NOTE — Telephone Encounter (Signed)
NEEDS REFILL ON Prudy Feeler  [phone]

## 2012-12-22 NOTE — Telephone Encounter (Signed)
FORGOT TO ASK WH/PHARMACY

## 2013-01-21 ENCOUNTER — Encounter: Payer: Self-pay | Admitting: Family Medicine

## 2013-01-21 ENCOUNTER — Ambulatory Visit (INDEPENDENT_AMBULATORY_CARE_PROVIDER_SITE_OTHER): Payer: BC Managed Care – PPO | Admitting: Family Medicine

## 2013-01-21 VITALS — BP 126/86 | HR 83 | Temp 99.3°F | Resp 16 | Ht 68.0 in | Wt 148.0 lb

## 2013-01-21 DIAGNOSIS — F411 Generalized anxiety disorder: Secondary | ICD-10-CM

## 2013-01-21 DIAGNOSIS — F419 Anxiety disorder, unspecified: Secondary | ICD-10-CM

## 2013-01-21 DIAGNOSIS — I10 Essential (primary) hypertension: Secondary | ICD-10-CM

## 2013-01-21 MED ORDER — CITALOPRAM HYDROBROMIDE 10 MG PO TABS: 10.0000 mg | ORAL_TABLET | Freq: Every day | ORAL | Status: DC

## 2013-01-21 MED ORDER — ALPRAZOLAM 0.5 MG PO TABS: ORAL_TABLET | ORAL | Status: DC

## 2013-01-21 MED ORDER — LOSARTAN POTASSIUM 50 MG PO TABS: 50.0000 mg | ORAL_TABLET | Freq: Every day | ORAL | Status: DC

## 2013-01-21 NOTE — Progress Notes (Signed)
S:  This 37 y.o. Cauc female is here for HTN follow-up and medication refills. She is well-controlled on Losartan and has no side effects (i.e. cough). She needs refills for Citalopram and Alprazolam which she takes for anxiety. 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  ROS: Negative for fatigue, appetite change, unexplained weight change, CP or tightness, palpitations, edema. SOB or DOE, HA, dizziness, numbness, weakness or syncope. She sleeps well and has no agitation, behavior change or dysphoric mood.  O:  Filed Vitals:   01/21/13 1640  BP: 126/86  Pulse: 83  Temp: 99.3 F (37.4 C)  Resp: 16   GEN: In NAD; WN,WD. HENT: Trainer/AT. EOMI w/ injected R conj (pt treated for "pink-eye" at another Urgent Care this AM). L conj clear. Otherwise normal. COR: RRR. LUNGS: Normal resp rate and effort. SKIN: W&D. No pallor or rashes. NEURO: A&O x 3; CNs intact. Nonfocal.  A/P: HTN (hypertension) - well- controlled and stable.  Plan: Continue Losartan 50 MG tablet  1 tab daily  Anxiety - stable on current medications.  Plan: Continue ALPRAZolam (XANAX) 0.5 MG tablet, citalopram (CELEXA) 10 MG tablet  Schedule CPE/PAP in April 2014.

## 2013-03-18 ENCOUNTER — Encounter: Payer: Self-pay | Admitting: Family Medicine

## 2013-05-13 ENCOUNTER — Other Ambulatory Visit: Payer: Self-pay | Admitting: Family Medicine

## 2013-05-13 NOTE — Telephone Encounter (Signed)
Alprazolam called to pt's pharmacy.

## 2013-05-15 ENCOUNTER — Encounter: Payer: Self-pay | Admitting: Family Medicine

## 2013-05-15 ENCOUNTER — Ambulatory Visit (INDEPENDENT_AMBULATORY_CARE_PROVIDER_SITE_OTHER): Payer: BC Managed Care – PPO | Admitting: Family Medicine

## 2013-05-15 ENCOUNTER — Other Ambulatory Visit: Payer: Self-pay | Admitting: Family Medicine

## 2013-05-15 VITALS — BP 116/68 | HR 83 | Temp 98.5°F | Resp 16 | Ht 68.0 in | Wt 138.2 lb

## 2013-05-15 DIAGNOSIS — D229 Melanocytic nevi, unspecified: Secondary | ICD-10-CM | POA: Insufficient documentation

## 2013-05-15 DIAGNOSIS — Z0000 Encounter for general adult medical examination without abnormal findings: Secondary | ICD-10-CM

## 2013-05-15 DIAGNOSIS — Z1231 Encounter for screening mammogram for malignant neoplasm of breast: Secondary | ICD-10-CM

## 2013-05-15 DIAGNOSIS — Z124 Encounter for screening for malignant neoplasm of cervix: Secondary | ICD-10-CM

## 2013-05-15 DIAGNOSIS — Z01419 Encounter for gynecological examination (general) (routine) without abnormal findings: Secondary | ICD-10-CM

## 2013-05-15 DIAGNOSIS — F411 Generalized anxiety disorder: Secondary | ICD-10-CM

## 2013-05-15 DIAGNOSIS — I10 Essential (primary) hypertension: Secondary | ICD-10-CM

## 2013-05-15 DIAGNOSIS — J302 Other seasonal allergic rhinitis: Secondary | ICD-10-CM | POA: Insufficient documentation

## 2013-05-15 DIAGNOSIS — Z23 Encounter for immunization: Secondary | ICD-10-CM

## 2013-05-15 LAB — LIPID PANEL
Cholesterol: 151 mg/dL (ref 0–200)
LDL Cholesterol: 78 mg/dL (ref 0–99)
Total CHOL/HDL Ratio: 2.6 Ratio
VLDL: 15 mg/dL (ref 0–40)

## 2013-05-15 LAB — COMPREHENSIVE METABOLIC PANEL
AST: 17 U/L (ref 0–37)
Albumin: 4.5 g/dL (ref 3.5–5.2)
Alkaline Phosphatase: 102 U/L (ref 39–117)
BUN: 22 mg/dL (ref 6–23)
Creat: 0.64 mg/dL (ref 0.50–1.10)
Potassium: 4.2 mEq/L (ref 3.5–5.3)

## 2013-05-15 LAB — CBC WITH DIFFERENTIAL/PLATELET
Eosinophils Absolute: 0.1 10*3/uL (ref 0.0–0.7)
Eosinophils Relative: 1 % (ref 0–5)
Hemoglobin: 13.8 g/dL (ref 12.0–15.0)
Lymphs Abs: 2 10*3/uL (ref 0.7–4.0)
MCH: 30.1 pg (ref 26.0–34.0)
MCV: 88 fL (ref 78.0–100.0)
Monocytes Absolute: 0.6 10*3/uL (ref 0.1–1.0)
Monocytes Relative: 7 % (ref 3–12)
RBC: 4.58 MIL/uL (ref 3.87–5.11)

## 2013-05-15 LAB — POCT URINALYSIS DIPSTICK
Blood, UA: NEGATIVE
Ketones, UA: NEGATIVE
Protein, UA: NEGATIVE
Spec Grav, UA: 1.025
Urobilinogen, UA: 0.2

## 2013-05-15 MED ORDER — CITALOPRAM HYDROBROMIDE 10 MG PO TABS: 10.0000 mg | ORAL_TABLET | Freq: Every day | ORAL | Status: DC

## 2013-05-15 MED ORDER — LOSARTAN POTASSIUM 50 MG PO TABS: 50.0000 mg | ORAL_TABLET | Freq: Every day | ORAL | Status: DC

## 2013-05-15 NOTE — Progress Notes (Addendum)
Subjective:    Patient ID: Tonya Holland, female    DOB: 07-01-1976, 37 y.o.   MRN: [id number]  HPI  This 37 y.o. Cauc female is here for CPE/PAP, last performed 4 years ago. Pt has chronic   anxiety and HTN; both conditions well controlled on current medications. She had a benign breast   mass removed 15 years ago; has not had screening MMG in last 5 years. She is monogamous w/  current partner x 11 years. She does not desire STD screening. Pt needs Tdap.   Patient Active Problem List   Diagnosis Date Noted  . Seasonal allergies 05/15/2013  . Anxiety 03/01/2012  . Cough 01/11/2012  . Hypertension 01/11/2012    PMHX, Soc Hx and Fam hx reviewed.   Review of Systems  Constitutional: Negative.   HENT: Negative.   Eyes: Negative.   Respiratory: Negative.   Cardiovascular: Negative.   Gastrointestinal: Negative.   Endocrine: Negative.   Genitourinary: Negative.   Musculoskeletal: Negative.   Skin: Negative.   Allergic/Immunologic: Negative.   Neurological: Negative.   Hematological: Negative.   Psychiatric/Behavioral: Negative.        Objective:   Physical Exam  Nursing note and vitals reviewed. Constitutional: She is oriented to person, place, and time. Vital signs are normal. She appears well-developed and well-nourished. No distress.  HENT:  Head: Normocephalic and atraumatic.  Right Ear: Hearing, tympanic membrane, external ear and ear canal normal.  Left Ear: Hearing, tympanic membrane, external ear and ear canal normal.  Nose: Nose normal. No mucosal edema, rhinorrhea, nasal deformity or septal deviation.  Mouth/Throat: Uvula is midline, oropharynx is clear and moist and mucous membranes are normal. No oral lesions. Normal dentition. No dental caries.  Eyes: Conjunctivae, EOM and lids are normal. Pupils are equal, round, and reactive to light. No scleral icterus.  Fundoscopic exam:      The right eye shows red reflex.       The left eye shows red reflex.   Pt has annual vision evaluation; she wears contacts.  Neck: Normal range of motion. Neck supple. No thyromegaly present.  Cardiovascular: Normal rate, regular rhythm, normal heart sounds and intact distal pulses.  Exam reveals no gallop and no friction rub.   No murmur heard. Pulmonary/Chest: Effort normal and breath sounds normal. No respiratory distress. She has no wheezes. She exhibits no tenderness. Right breast exhibits no inverted nipple, no mass, no nipple discharge, no skin change and no tenderness. Left breast exhibits no inverted nipple, no mass, no nipple discharge, no skin change and no tenderness. Breasts are symmetrical.    Abdominal: Soft. Normal appearance and bowel sounds are normal. She exhibits no distension, no pulsatile midline mass and no mass. There is no hepatosplenomegaly. There is no tenderness. There is no guarding and no CVA tenderness.  Genitourinary: Vagina normal and uterus normal. There is no rash, tenderness or lesion on the right labia. There is no rash, tenderness or lesion on the left labia. Uterus is not deviated, not enlarged, not fixed and not tender. Cervix exhibits discharge. Cervix exhibits no motion tenderness and no friability. Right adnexum displays no mass, no tenderness and no fullness. Left adnexum displays no mass, no tenderness and no fullness. No erythema or tenderness around the vagina. No foreign body around the vagina. No signs of injury around the vagina. No vaginal discharge found.  Musculoskeletal: Normal range of motion. She exhibits no edema and no tenderness.  Lymphadenopathy:    She has no cervical  adenopathy.       Right: No inguinal adenopathy present.       Left: No inguinal adenopathy present.  Neurological: She is alert and oriented to person, place, and time. She has normal reflexes. No cranial nerve deficit. She exhibits normal muscle tone. Coordination normal.  Skin: Skin is warm and dry. No rash noted. No erythema. No pallor.  Pt  has multiple nevi of varying sizes.  Psychiatric: She has a normal mood and affect. Her behavior is normal. Judgment and thought content normal.     Results for orders placed in visit on 05/15/13  POCT URINALYSIS DIPSTICK      Result Value Range   Color, UA yellow     Clarity, UA clear     Glucose, UA neg     Bilirubin, UA neg     Ketones, UA neg     Spec Grav, UA 1.025     Blood, UA neg     pH, UA 5.5     Protein, UA neg     Urobilinogen, UA 0.2     Nitrite, UA neg     Leukocytes, UA Negative         Assessment & Plan:  Routine general medical examination at a health care facility - Plan: POCT urinalysis dipstick, Lipid panel, Comprehensive metabolic panel, Vitamin D, 25-hydroxy, CBC with Differential  Encounter for cervical Pap smear with pelvic exam - Plan: Pap IG and Chlamydia/Gonococcus, NAA  HTN, goal below 140/80- Stable; continue current medication.  Anxiety- Stable; continue current medication.  Other screening mammogram - Plan: MM Digital Screening  Need for prophylactic vaccination with combined diphtheria-tetanus-pertussis (DTP) vaccine - Plan: Tdap vaccine greater than or equal to 7yo IM  Meds ordered this encounter  Medications  . DISCONTD: citalopram (CELEXA) 10 MG tablet    Sig: Take 10 mg by mouth daily. NEED REFILLS  . DISCONTD: losartan (COZAAR) 50 MG tablet    Sig: Take 50 mg by mouth daily. NEED REFILLS  . citalopram (CELEXA) 10 MG tablet    Sig: Take 1 tablet (10 mg total) by mouth daily.    Dispense:  30 tablet    Refill:  11  . losartan (COZAAR) 50 MG tablet    Sig: Take 1 tablet (50 mg total) by mouth daily.    Dispense:  30 tablet    Refill:  11    Pt advised to have skin surveillance through free clinic organized with [facility]at[facility] and [facility]; information can be found online.

## 2013-05-15 NOTE — Patient Instructions (Signed)
Keeping You Healthy  Get These Tests 1. Blood Pressure- Have your blood pressure checked once a year by your health care provider.  Normal blood pressure is 120/80. 2. Weight- Have your body mass index (BMI) calculated to screen for obesity.  BMI is measure of body fat based on height and weight.  You can also calculate your own BMI at https://www.west-esparza.com/. 3. Cholesterol- Have your cholesterol checked every 5 years starting at age 38 then yearly starting at age 21. 4. Chlamydia, HIV, and other sexually transmitted diseases- Get screened every year until age 90, then within three months of each new sexual provider. 5. Pap Smear- Every 1-3 years; discuss with your health care provider. 6. Mammogram- Every year starting at age 81  Take these medicines  Calcium with Vitamin D-Your body needs 1200 mg of Calcium each day and [phone] IU of Vitamin D daily.  Your body can only absorb 500 mg of Calcium at a time so Calcium must be taken in 2 or 3 divided doses throughout the day.  Multivitamin with folic acid- Once daily if it is possible for you to become pregnant.  Get these Immunizations  Menactra-Single dose; prevents meningitis.  Tetanus shot- Every 10 years. Today you received a Tdap; this is a one-time immunization. Next Tetanus is due in 2024.  Flu shot-Every year.  Take these steps 1. Do not smoke-Your healthcare provider can help you quit.  For tips on how to quit go to www.smokefree.gov or call 1-800 QUITNOW. 2. Be physically active- Exercise 5 days a week for at least 30 minutes.  If you are not already physically active, start slow and gradually work up to 30 minutes of moderate physical activity.  Examples of moderate activity include walking briskly, dancing, swimming, bicycling, etc. 3. Breast Cancer- A self breast exam every month is important for early detection of breast cancer.  For more information and instruction on self breast exams, ask your healthcare provider or  [employer].es. 4. Eat a healthy diet- Eat a variety of healthy foods such as fruits, vegetables, whole grains, low fat milk, low fat cheeses, yogurt, lean meats, poultry and fish, beans, nuts, tofu, etc.  For more information go to www. Thenutritionsource.org 5. Drink alcohol in moderation- Limit alcohol intake to one drink or less per day. Never drink and drive. 6. Depression- Your emotional health is as important as your physical health.  If you're feeling down or losing interest in things you normally enjoy please talk to your healthcare provider about being screened for depression. 7. Dental visit- Brush and floss your teeth twice daily; visit your dentist twice a year. 8. Eye doctor- Get an eye exam at least every 2 years. 9. Helmet use- Always wear a helmet when riding a bicycle, motorcycle, rollerblading or skateboarding. 10. Safe sex- If you may be exposed to sexually transmitted infections, use a condom. 11. Seat belts- Seat belts can save your live; always wear one. 12. Smoke/Carbon Monoxide detectors- These detectors need to be installed on the appropriate level of your home. Replace batteries at least once a year. 13. Skin cancer- When out in the sun please cover up and use sunscreen 15 SPF or higher. 14. Violence- If anyone is threatening or hurting you, please tell your healthcare provider.

## 2013-05-19 LAB — PAP IG AND CT-NG NAA

## 2013-05-20 ENCOUNTER — Telehealth: Payer: Self-pay

## 2013-05-20 ENCOUNTER — Other Ambulatory Visit: Payer: Self-pay | Admitting: Family Medicine

## 2013-05-20 NOTE — Progress Notes (Signed)
Quick Note:  Please notify that PAP shows some mildly atypical cells (significance not know); will add additional test to check for HPV. Otherwise, advise repeat PAP in 1 year.  All other labs are normal.  Copy to pt. ______

## 2013-05-20 NOTE — Telephone Encounter (Signed)
Message copied by Johnnette Litter on Tue May 20, 2013  7:05 PM ------      Message from: Maurice March      Created: Tue May 20, 2013  6:17 PM        PAP result was ASC-US; can you have lab reflex this specimen to HPV?            Thanks. ------

## 2013-05-20 NOTE — Telephone Encounter (Signed)
Spoke with Jennette Kettle at [place]. Unable to add on, cytology has too. Can we please call back in the morning. Thanks

## 2013-05-21 ENCOUNTER — Emergency Department (HOSPITAL_BASED_OUTPATIENT_CLINIC_OR_DEPARTMENT_OTHER)
Admission: EM | Admit: 2013-05-21 | Discharge: 2013-05-21 | Disposition: A | Discharge status: Home / Self Care | Payer: BC Managed Care – PPO | Attending: Emergency Medicine | Admitting: Emergency Medicine

## 2013-05-21 ENCOUNTER — Emergency Department (HOSPITAL_BASED_OUTPATIENT_CLINIC_OR_DEPARTMENT_OTHER): Payer: BC Managed Care – PPO

## 2013-05-21 ENCOUNTER — Encounter (HOSPITAL_BASED_OUTPATIENT_CLINIC_OR_DEPARTMENT_OTHER): Payer: Self-pay | Admitting: *Deleted

## 2013-05-21 DIAGNOSIS — S161XXA Strain of muscle, fascia and tendon at neck level, initial encounter: Secondary | ICD-10-CM

## 2013-05-21 DIAGNOSIS — S139XXA Sprain of joints and ligaments of unspecified parts of neck, initial encounter: Secondary | ICD-10-CM | POA: Insufficient documentation

## 2013-05-21 DIAGNOSIS — Y92410 Unspecified street and highway as the place of occurrence of the external cause: Secondary | ICD-10-CM | POA: Insufficient documentation

## 2013-05-21 DIAGNOSIS — S46909A Unspecified injury of unspecified muscle, fascia and tendon at shoulder and upper arm level, unspecified arm, initial encounter: Secondary | ICD-10-CM | POA: Insufficient documentation

## 2013-05-21 DIAGNOSIS — Z79899 Other long term (current) drug therapy: Secondary | ICD-10-CM | POA: Insufficient documentation

## 2013-05-21 DIAGNOSIS — R079 Chest pain, unspecified: Secondary | ICD-10-CM

## 2013-05-21 DIAGNOSIS — S4980XA Other specified injuries of shoulder and upper arm, unspecified arm, initial encounter: Secondary | ICD-10-CM | POA: Insufficient documentation

## 2013-05-21 DIAGNOSIS — G8929 Other chronic pain: Secondary | ICD-10-CM | POA: Insufficient documentation

## 2013-05-21 DIAGNOSIS — R51 Headache: Secondary | ICD-10-CM | POA: Insufficient documentation

## 2013-05-21 DIAGNOSIS — I10 Essential (primary) hypertension: Secondary | ICD-10-CM | POA: Insufficient documentation

## 2013-05-21 DIAGNOSIS — S298XXA Other specified injuries of thorax, initial encounter: Secondary | ICD-10-CM | POA: Insufficient documentation

## 2013-05-21 DIAGNOSIS — S8990XA Unspecified injury of unspecified lower leg, initial encounter: Secondary | ICD-10-CM | POA: Insufficient documentation

## 2013-05-21 DIAGNOSIS — Z880 Allergy status to penicillin: Secondary | ICD-10-CM | POA: Insufficient documentation

## 2013-05-21 DIAGNOSIS — J45909 Unspecified asthma, uncomplicated: Secondary | ICD-10-CM | POA: Insufficient documentation

## 2013-05-21 DIAGNOSIS — Y9389 Activity, other specified: Secondary | ICD-10-CM | POA: Insufficient documentation

## 2013-05-21 DIAGNOSIS — S99929A Unspecified injury of unspecified foot, initial encounter: Secondary | ICD-10-CM | POA: Insufficient documentation

## 2013-05-21 MED ORDER — HYDROCODONE-ACETAMINOPHEN 5-325 MG PO TABS: 1.0000 | ORAL_TABLET | ORAL | Status: DC | PRN

## 2013-05-21 MED ORDER — CYCLOBENZAPRINE HCL 5 MG PO TABS: 5.0000 mg | ORAL_TABLET | Freq: Two times a day (BID) | ORAL | Status: DC | PRN

## 2013-05-21 NOTE — ED Notes (Signed)
NP at bedside.

## 2013-05-21 NOTE — Telephone Encounter (Signed)
See labs 
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
## 2013-05-21 NOTE — ED Provider Notes (Signed)
Medical screening examination/treatment/procedure(s) were performed by non-physician practitioner and as supervising physician I was immediately available for consultation/collaboration.  Geoffery Lyons, MD 05/21/13 2056
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
## 2013-05-23 NOTE — Progress Notes (Signed)
Quick Note:  Please contact pt and advise that the testing for HPV on her PAP is negative (no HPV detected). Next PAP in 12 months as advised.   Copy to pt. ______

## 2013-07-27 ENCOUNTER — Other Ambulatory Visit: Payer: Self-pay | Admitting: Family Medicine

## 2013-07-29 NOTE — Telephone Encounter (Signed)
Alprazolam refill phoned to pt's pharmacy. 

## 2013-09-04 ENCOUNTER — Telehealth: Payer: Self-pay | Admitting: *Deleted

## 2013-09-04 NOTE — Telephone Encounter (Signed)
Called patient left message to call back concerning forms to donate plasma at [facility].

## 2013-09-05 ENCOUNTER — Telehealth: Payer: Self-pay | Admitting: *Deleted

## 2013-09-05 NOTE — Telephone Encounter (Signed)
Faxed completed and signed forms to [facility], per Dr McPherson.  

## 2013-09-05 NOTE — Telephone Encounter (Signed)
Patient returned call and she advised me a lump was removed in the right breast in 1998. The diagnoses was fibroadenoma non-cancerous.

## 2013-10-07 ENCOUNTER — Telehealth: Payer: Self-pay

## 2013-10-07 NOTE — Telephone Encounter (Signed)
PT STATES SHE IS SEEING A COUNSELOR AND THEY WANT HER MEDICATION ADJUSTED. PLEASE CALL [phone]

## 2013-10-07 NOTE — Telephone Encounter (Signed)
What does she want adjusted? Left message for her to call me back.

## 2013-10-08 NOTE — Telephone Encounter (Signed)
She wants Celexa to be increased to 20 mg and she wants you to add Buspar.

## 2013-10-10 NOTE — Telephone Encounter (Signed)
Faxed completed and signed forms to [employer], per Dr Audria Nine.

## 2013-10-14 MED ORDER — CITALOPRAM HYDROBROMIDE 20 MG PO TABS: 20.0000 mg | ORAL_TABLET | Freq: Every day | ORAL | Status: DC

## 2013-10-14 MED ORDER — BUSPIRONE HCL 5 MG PO TABS: ORAL_TABLET | ORAL | Status: DC

## 2013-10-14 NOTE — Telephone Encounter (Signed)
I spoke with pt about her situation requiring increase of Citalopram to 20 mg; pt has been on increased dose for 4 days and feels good. I will add Buspar generic 5 mg to be taken 1 tab twice daily. Pt understands and will check back in with me in 2 -3 week.

## 2013-10-14 NOTE — Telephone Encounter (Signed)
Pt had LM on my VM checking status of this message. I called back and advised that Dr Audria Nine had been out of the office last week but returns this afternoon. Pt reported that the name of her therapist is Nathen May phone [phone] if Dr Audria Nine needs to consult with her.

## 2013-10-17 NOTE — Telephone Encounter (Signed)
Error

## 2013-11-08 ENCOUNTER — Other Ambulatory Visit: Payer: Self-pay | Admitting: Family Medicine

## 2013-11-10 NOTE — Telephone Encounter (Signed)
Alprazolam refill phoned to pt's pharmacy. Please notify pt.

## 2013-11-12 NOTE — Telephone Encounter (Signed)
Left a message that prescription was sent to pharmacy.

## 2013-11-20 ENCOUNTER — Ambulatory Visit: Payer: BC Managed Care – PPO | Admitting: Family Medicine

## 2014-01-11 ENCOUNTER — Other Ambulatory Visit: Payer: Self-pay | Admitting: Family Medicine

## 2014-01-13 NOTE — Telephone Encounter (Signed)
Alprazolam refill phoned to pt's pharmacy. She will need to schedule an OV for additional refills. Last visit w/ me was June 2014.

## 2014-01-15 NOTE — Telephone Encounter (Signed)
Spoke with patient she had already picked up medicine from pharmacy

## 2014-02-20 ENCOUNTER — Ambulatory Visit (INDEPENDENT_AMBULATORY_CARE_PROVIDER_SITE_OTHER): Payer: 59 | Admitting: Family Medicine

## 2014-02-20 VITALS — BP 129/78 | HR 66 | Temp 98.1°F | Resp 16 | Ht 68.5 in | Wt 140.6 lb

## 2014-02-20 DIAGNOSIS — I10 Essential (primary) hypertension: Secondary | ICD-10-CM

## 2014-02-20 DIAGNOSIS — F4323 Adjustment disorder with mixed anxiety and depressed mood: Secondary | ICD-10-CM

## 2014-02-20 MED ORDER — BUSPIRONE HCL 5 MG PO TABS: ORAL_TABLET | ORAL | Status: DC

## 2014-02-20 MED ORDER — LOSARTAN POTASSIUM 50 MG PO TABS: 50.0000 mg | ORAL_TABLET | Freq: Every day | ORAL | Status: DC

## 2014-02-20 MED ORDER — ALPRAZOLAM 0.5 MG PO TABS: ORAL_TABLET | ORAL | Status: DC

## 2014-02-20 MED ORDER — CITALOPRAM HYDROBROMIDE 20 MG PO TABS: 20.0000 mg | ORAL_TABLET | Freq: Every day | ORAL | Status: DC

## 2014-02-20 NOTE — Progress Notes (Signed)
[facility] [address] [place] [address] [phone]  Date:  02/20/2014   Name:  Tonya Holland   DOB:  [birth year]   MRN:  [id number]  PCP:  Ellsworth Lennox, MD    Chief Complaint: Medication Refill   History of Present Illness:  Tonya Holland is a 38 y.o. very pleasant female patient who presents with the following:  She needs some refills today- her appt with Dr. Leward Quan is later on this month.    She had cough with ace- I in the past; no history of anaphylaxis. She is doing well on her ARB for BP  She takes phentermine on occasion as needed for weight maintenance.     She feels that her depression sx are improved. On a scale of 1- 10 her mood is an 8 on average.  Her sleep is "pretty good", she does take melatonin at bedtime Her appetite is ok.   No SI or HI She uses buspar twice a day. She takes xanax generally once during the day.  She actually does not use this at night  LMP about 3 weeks ago; she is SA with female only, so no risk of pregnancy.   Patient Active Problem List   Diagnosis Date Noted  . Seasonal allergies 05/15/2013  . Multiple nevi 05/15/2013  . Anxiety 03/01/2012  . Cough 01/11/2012  . Hypertension 01/11/2012    Past Medical History  Diagnosis Date  . Hypertension   . Chronic headache   . Allergy   . Asthma     Past Surgical History  Procedure Laterality Date  . Breast tumor removed  at age 19    right  . Lasik  at age 13   . Eye surgery      both 1998 and right 1998  . Breast surgery  1999    right    History  Substance Use Topics  . Smoking status: Never Smoker   . Smokeless tobacco: Not on file  . Alcohol Use: Yes     Comment: occasionally wine    Family History  Problem Relation Age of Onset  . Allergies Mother   . Asthma Mother   . Breast cancer Mother   . Hypertension Father   . Cancer Paternal Grandmother     breast    Allergies  Allergen Reactions  . Ace  Inhibitors   . Penicillins     childhood    Medication list has been reviewed and updated.  Current Outpatient Prescriptions on File Prior to Visit  Medication Sig Dispense Refill  . ALPRAZolam (XANAX) 0.5 MG tablet TAKE 1 TABLET AT BEDTIME AS NEEDED FOR SLEEP  30 tablet  0  . busPIRone (BUSPAR) 5 MG tablet Take 1 tablet twice daily for anxiety.  60 tablet  3  . cetirizine (ZYRTEC) 10 MG tablet Take 10 mg by mouth as needed.      . citalopram (CELEXA) 20 MG tablet Take 1 tablet (20 mg total) by mouth daily.  30 tablet  3  . fluticasone (FLONASE) 50 MCG/ACT nasal spray Place 2 sprays into the nose as needed.  16 g  5  . losartan (COZAAR) 50 MG tablet Take 1 tablet (50 mg total) by mouth daily.  30 tablet  11  . Melatonin-Pyridoxine (MELATIN PO) Take by mouth daily.      . naproxen (NAPROSYN) 500 MG tablet Take 1 tablet (500 mg total) by mouth as needed.  60 tablet  2  .  phentermine 37.5 MG capsule Take 37.5 mg by mouth daily. PER PATIENT TAKE EVERY OTHER DAY      . vitamin C (ASCORBIC ACID) 500 MG tablet Take 500 mg by mouth as needed.      . cyclobenzaprine (FLEXERIL) 5 MG tablet Take 1 tablet (5 mg total) by mouth 2 (two) times daily as needed for muscle spasms.  10 tablet  0  . HYDROcodone-acetaminophen (NORCO/VICODIN) 5-325 MG per tablet Take 1 tablet by mouth every 4 (four) hours as needed for pain.  10 tablet  0   No current facility-administered medications on file prior to visit.    Review of Systems:  As per HPI- otherwise negative.   Physical Examination: Filed Vitals:   02/20/14 1759  BP: 129/78  Pulse: 66  Temp: 98.1 F (36.7 C)  Resp: 16   Filed Vitals:   02/20/14 1759  Height: 5' 8.5" (1.74 m)  Weight: 140 lb 9.6 oz (63.776 kg)   Body mass index is 21.06 kg/(m^2). Ideal Body Weight: Weight in (lb) to have BMI = 25: 166.5  GEN: WDWN, NAD, Non-toxic, A & O x 3, looks well HEENT: Atraumatic, Normocephalic. Neck supple. No masses, No LAD. Ears and Nose: No  external deformity. CV: RRR, No M/G/R. No JVD. No thrill. No extra heart sounds. PULM: CTA B, no wheezes, crackles, rhonchi. No retractions. No resp. distress. No accessory muscle use. EXTR: No c/c/e NEURO Normal gait.  PSYCH: Normally interactive. Conversant. Not depressed or anxious appearing.  Calm demeanor.    Assessment and Plan: HTN (hypertension) - Plan: losartan (COZAAR) 50 MG tablet  Adjustment disorder with mixed anxiety and depressed mood - Plan: citalopram (CELEXA) 20 MG tablet, busPIRone (BUSPAR) 5 MG tablet, ALPRAZolam (XANAX) 0.5 MG tablet, DISCONTINUED: ALPRAZolam (XANAX) 0.5 MG tablet  Refilled her medications- she is stable on her current regimen.  She will see Dr. Leward Quan soon for her regular follow-up appt.    Signed Lamar Blinks, MD

## 2014-03-03 ENCOUNTER — Ambulatory Visit: Payer: BC Managed Care – PPO | Admitting: Family Medicine

## 2014-05-29 ENCOUNTER — Ambulatory Visit (INDEPENDENT_AMBULATORY_CARE_PROVIDER_SITE_OTHER): Payer: 59 | Admitting: Family Medicine

## 2014-05-29 ENCOUNTER — Encounter: Payer: Self-pay | Admitting: Family Medicine

## 2014-05-29 VITALS — BP 122/80 | HR 73 | Temp 98.0°F | Resp 18 | Ht 68.5 in | Wt 141.0 lb

## 2014-05-29 DIAGNOSIS — Z803 Family history of malignant neoplasm of breast: Secondary | ICD-10-CM

## 2014-05-29 DIAGNOSIS — Z8742 Personal history of other diseases of the female genital tract: Secondary | ICD-10-CM

## 2014-05-29 DIAGNOSIS — D239 Other benign neoplasm of skin, unspecified: Secondary | ICD-10-CM

## 2014-05-29 DIAGNOSIS — Z01419 Encounter for gynecological examination (general) (routine) without abnormal findings: Secondary | ICD-10-CM

## 2014-05-29 DIAGNOSIS — Z124 Encounter for screening for malignant neoplasm of cervix: Secondary | ICD-10-CM

## 2014-05-29 DIAGNOSIS — F411 Generalized anxiety disorder: Secondary | ICD-10-CM

## 2014-05-29 DIAGNOSIS — D229 Melanocytic nevi, unspecified: Secondary | ICD-10-CM

## 2014-05-29 DIAGNOSIS — N72 Inflammatory disease of cervix uteri: Secondary | ICD-10-CM

## 2014-05-29 DIAGNOSIS — Z0000 Encounter for general adult medical examination without abnormal findings: Secondary | ICD-10-CM

## 2014-05-29 DIAGNOSIS — F419 Anxiety disorder, unspecified: Secondary | ICD-10-CM

## 2014-05-29 DIAGNOSIS — I10 Essential (primary) hypertension: Secondary | ICD-10-CM

## 2014-05-29 DIAGNOSIS — F4323 Adjustment disorder with mixed anxiety and depressed mood: Secondary | ICD-10-CM

## 2014-05-29 LAB — POCT URINALYSIS DIPSTICK
BILIRUBIN UA: NEGATIVE
Blood, UA: NEGATIVE
GLUCOSE UA: NEGATIVE
Ketones, UA: NEGATIVE
LEUKOCYTES UA: NEGATIVE
NITRITE UA: NEGATIVE
Protein, UA: NEGATIVE
Spec Grav, UA: 1.01
UROBILINOGEN UA: 0.2
pH, UA: 6

## 2014-05-29 LAB — COMPLETE METABOLIC PANEL WITH GFR
ALT: 19 U/L (ref 0–35)
AST: 18 U/L (ref 0–37)
Albumin: 4.5 g/dL (ref 3.5–5.2)
Alkaline Phosphatase: 82 U/L (ref 39–117)
BILIRUBIN TOTAL: 0.6 mg/dL (ref 0.2–1.2)
BUN: 11 mg/dL (ref 6–23)
CALCIUM: 9.5 mg/dL (ref 8.4–10.5)
CO2: 28 meq/L (ref 19–32)
CREATININE: 0.66 mg/dL (ref 0.50–1.10)
Chloride: 102 mEq/L (ref 96–112)
GFR, Est Non African American: >89 mL/min
Glucose, Bld: 85 mg/dL (ref 70–99)
Potassium: 4.3 mEq/L (ref 3.5–5.3)
Sodium: 139 mEq/L (ref 135–145)
Total Protein: 7.2 g/dL (ref 6.0–8.3)

## 2014-05-29 LAB — POCT WET PREP WITH KOH
KOH PREP POC: NEGATIVE
TRICHOMONAS UA: NEGATIVE
YEAST WET PREP PER HPF POC: NEGATIVE

## 2014-05-29 MED ORDER — CITALOPRAM HYDROBROMIDE 20 MG PO TABS: 20.0000 mg | ORAL_TABLET | Freq: Every day | ORAL | Status: DC

## 2014-05-29 MED ORDER — BUSPIRONE HCL 5 MG PO TABS: ORAL_TABLET | ORAL | Status: DC

## 2014-05-29 MED ORDER — ALPRAZOLAM 0.5 MG PO TABS: ORAL_TABLET | ORAL | Status: DC

## 2014-05-29 MED ORDER — CLINDAMYCIN HCL 150 MG PO CAPS: ORAL_CAPSULE | ORAL | Status: DC

## 2014-05-29 MED ORDER — LOSARTAN POTASSIUM 50 MG PO TABS: 50.0000 mg | ORAL_TABLET | Freq: Every day | ORAL | Status: DC

## 2014-05-29 NOTE — Progress Notes (Signed)
Subjective:    Patient ID: Tonya Holland, female    DOB: Aug 06, 1976, 38 y.o.   MRN: [id number]  HPI  This 38 y.o. Cauc female is here for annual CPE/PAP (June 2014- ASCUS). She has HTN, well controlled on current medication. Mixed mood disorder is stable on current medications. Pt requests DERM referral for skin surveillance; she also requests referral for Genetics counseling given family hx of breast cancer and Alzheimer's Dementia.  Patient Active Problem List   Diagnosis Date Noted  . Seasonal allergies 05/15/2013  . Multiple nevi 05/15/2013  . Anxiety 03/01/2012  . Cough 01/11/2012  . Hypertension 01/11/2012    Prior to Admission medications   Medication Sig Start Date End Date Taking? Authorizing Janmichael Giraud  ALPRAZolam Duanne Moron) 0.5 MG tablet Take once daily as needed for anxiety   Yes Barton Fanny, MD  busPIRone (BUSPAR) 5 MG tablet Take 1 tablet twice daily for anxiety.   Yes Barton Fanny, MD  cetirizine (ZYRTEC) 10 MG tablet Take 10 mg by mouth as needed.   Yes Historical Deshon Hsiao, MD  citalopram (CELEXA) 20 MG tablet Take 1 tablet (20 mg total) by mouth daily.   Yes Barton Fanny, MD  fluticasone [facility]) 50 MCG/ACT nasal spray Place 2 sprays into the nose as needed. 06/04/12  Yes Barton Fanny, MD  losartan (COZAAR) 50 MG tablet Take 1 tablet (50 mg total) by mouth daily.   Yes Barton Fanny, MD  Melatonin-Pyridoxine (MELATIN PO) Take by mouth daily.   Yes Historical Catelyn Friel, MD  naproxen (NAPROSYN) 500 MG tablet Take 1 tablet (500 mg total) by mouth as needed. 06/04/12  Yes Barton Fanny, MD  phentermine 37.5 MG capsule Take 37.5 mg by mouth daily. PER PATIENT TAKE EVERY OTHER DAY   Yes Historical Arletha Marschke, MD  vitamin C (ASCORBIC ACID) 500 MG tablet Take 500 mg by mouth as needed.   Yes Historical Sarann Tregre, MD   PMHx, Surg Hx, Soc and Fam Hx reviewed.   Review of Systems  Constitutional: Negative.   HENT: Negative.   Eyes:  Negative.   Respiratory: Negative.   Cardiovascular: Negative.   Endocrine: Negative.   Genitourinary: Negative.   Musculoskeletal: Positive for arthralgias. Negative for gait problem and myalgias.  Skin: Negative.   Allergic/Immunologic: Negative.   Neurological: Negative.   Hematological: Negative.   Psychiatric/Behavioral: Positive for sleep disturbance. Negative for suicidal ideas, hallucinations, confusion, self-injury, dysphoric mood and agitation. The patient is nervous/anxious.       Objective:   Physical Exam  Nursing note and vitals reviewed. Constitutional: She is oriented to person, place, and time. Vital signs are normal. She appears well-developed and well-nourished. No distress.  HENT:  Head: Normocephalic and atraumatic.  Right Ear: Hearing, tympanic membrane, external ear and ear canal normal.  Left Ear: Hearing, tympanic membrane, external ear and ear canal normal.  Nose: Nose normal. No nasal deformity or septal deviation.  Mouth/Throat: Uvula is midline, oropharynx is clear and moist and mucous membranes are normal. No oral lesions. Normal dentition. No dental caries.  Eyes: Conjunctivae, EOM and lids are normal. Pupils are equal, round, and reactive to light. No scleral icterus.  Fundoscopic exam:      The right eye shows no arteriolar narrowing and no papilledema. The right eye shows red reflex.       The left eye shows no arteriolar narrowing and no papilledema. The left eye shows red reflex.  Periodic vision evaluation by eye care professional.  Neck: Trachea normal, normal range of motion and full passive range of motion without pain. Neck supple. No JVD present. No spinous process tenderness and no muscular tenderness present. No thyromegaly present.  Cardiovascular: Normal rate, regular rhythm, S1 normal, S2 normal, normal heart sounds, intact distal pulses and normal pulses.   No extrasystoles are present. PMI is not displaced.  Exam reveals no gallop and no  friction rub.   No murmur heard. Pulmonary/Chest: Effort normal and breath sounds normal. No respiratory distress. Right breast exhibits no inverted nipple, no mass, no nipple discharge, no skin change and no tenderness. Left breast exhibits no inverted nipple, no mass, no nipple discharge, no skin change and no tenderness. Breasts are symmetrical.  Abdominal: Soft. Normal appearance and bowel sounds are normal. She exhibits no distension, no pulsatile midline mass and no mass. There is no hepatosplenomegaly. There is no tenderness. There is no guarding and no CVA tenderness.  Genitourinary: Rectum normal and uterus normal. No labial fusion. There is no rash, tenderness, lesion or injury on the right labia. There is no rash, tenderness, lesion or injury on the left labia. Uterus is not deviated, not enlarged, not fixed and not tender. Cervix exhibits discharge and friability. Cervix exhibits no motion tenderness. Right adnexum displays no mass and no tenderness. Left adnexum displays no mass, no tenderness and no fullness. There is erythema around the vagina. No tenderness or bleeding around the vagina. No foreign body around the vagina. No signs of injury around the vagina. Vaginal discharge found.  Musculoskeletal: Normal range of motion. She exhibits no edema and no tenderness.       Cervical back: Normal.       Thoracic back: Normal.       Lumbar back: Normal.  Remainder of exam unremarkable.  Lymphadenopathy:       Head (right side): No submental, no submandibular, no tonsillar, no preauricular, no posterior auricular and no occipital adenopathy present.       Head (left side): No submental, no submandibular, no tonsillar, no preauricular, no posterior auricular and no occipital adenopathy present.    She has no cervical adenopathy.    She has no axillary adenopathy.       Right: No inguinal and no supraclavicular adenopathy present.       Left: No inguinal and no supraclavicular adenopathy  present.  Neurological: She is alert and oriented to person, place, and time. She has normal strength and normal reflexes. She displays no atrophy and no tremor. No cranial nerve deficit or sensory deficit. She exhibits normal muscle tone. Coordination and gait normal.  Skin: Skin is warm, dry and intact. Lesion noted. No ecchymosis and no rash noted. She is not diaphoretic. No cyanosis or erythema. No pallor. Nails show no clubbing.  Torso- various sized hyperpigmented lesions.  Psychiatric: She has a normal mood and affect. Her speech is normal and behavior is normal. Judgment and thought content normal. Cognition and memory are normal.    Results for orders placed in visit on 05/29/14  POCT URINALYSIS DIPSTICK      Result Value Ref Range   Color, UA yellow     Clarity, UA clear     Glucose, UA neg     Bilirubin, UA neg     Ketones, UA neg     Spec Grav, UA 1.010     Blood, UA neg     pH, UA 6.0     Protein, UA neg     Urobilinogen,  UA 0.2     Nitrite, UA neg     Leukocytes, UA Negative    POCT WET PREP WITH KOH      Result Value Ref Range   Trichomonas, UA Negative     Clue Cells Wet Prep HPF POC 5-6     Epithelial Wet Prep HPF POC 5-6     Yeast Wet Prep HPF POC neg     Bacteria Wet Prep HPF POC 3+     RBC Wet Prep HPF POC 8-15     WBC Wet Prep HPF POC 7-20     KOH Prep POC Negative        Assessment & Plan:  Routine general medical examination at a health care facility - Plan: POCT urinalysis dipstick, COMPLETE METABOLIC PANEL WITH GFR  Encounter for cervical Pap smear with pelvic exam - Plan: Pap IG w/ reflex to HPV when ASC-U  Cervicitis - Bacterial vaginosis- RX; Clindamycin oral- see below. Plan: POCT Wet Prep with KOH  Essential hypertension- Stable and controlled on current medication; continue same.  Adjustment disorder with mixed anxiety and depressed mood - PHQ-9 score= 0. Stable on current medications; continue same. Plan: ALPRAZolam (XANAX) 0.5 MG tablet,  busPIRone (BUSPAR) 5 MG tablet, citalopram (CELEXA) 20 MG tablet  History of abnormal cervical Pap smear- ASCUS 1 year ago.  Multiple nevi - Plan: Ambulatory referral to Dermatology  Family history of breast cancer in first degree relative - Plan: Ambulatory referral to Genetics  Meds ordered this encounter  Medications  . losartan (COZAAR) 50 MG tablet    Sig: Take 1 tablet (50 mg total) by mouth daily.    Dispense:  30 tablet    Refill:  11  . ALPRAZolam (XANAX) 0.5 MG tablet    Sig: Take once daily as needed for anxiety    Dispense:  30 tablet    Refill:  3  . busPIRone (BUSPAR) 5 MG tablet    Sig: Take 1 tablet twice daily for anxiety.    Dispense:  60 tablet    Refill:  5  . citalopram (CELEXA) 20 MG tablet    Sig: Take 1 tablet (20 mg total) by mouth daily.    Dispense:  30 tablet    Refill:  5  . clindamycin (CLEOCIN) 150 MG capsule    Sig: Take 1 capsule twice a day for 1 week.    Dispense:  14 capsule    Refill:  0

## 2014-06-02 LAB — PAP IG W/ RFLX HPV ASCU

## 2014-06-03 LAB — HUMAN PAPILLOMAVIRUS, HIGH RISK: HPV DNA HIGH RISK: NOT DETECTED

## 2014-06-04 NOTE — Progress Notes (Signed)
Quick Note:  Please advise pt regarding following labs... All labs are normal. PAP shows some abnormal cells once again but no High Risk HPV detected (HPV is the Human Papilloma Virus that has been linked to cervical dysplasia and/or cancer).  I suggest PAP be repeated in 1 year.  Copy to pt. ______

## 2014-07-02 ENCOUNTER — Telehealth: Payer: Self-pay | Admitting: Genetic Counselor

## 2014-07-02 NOTE — Telephone Encounter (Signed)
LEFT MESSAGE FOR PATIENT TO RETURN CALL TO SCHEDULE GENETIC APPT.  °

## 2014-07-06 ENCOUNTER — Telehealth: Payer: Self-pay | Admitting: Genetic Counselor

## 2014-07-06 NOTE — Telephone Encounter (Signed)
S/W PATIENT AND GAVE GENETIC APPT FOR 08/12 @ 3:30 W/GENETIC COUNSELOR

## 2014-07-15 ENCOUNTER — Ambulatory Visit (HOSPITAL_BASED_OUTPATIENT_CLINIC_OR_DEPARTMENT_OTHER): Payer: 59 | Admitting: Genetic Counselor

## 2014-07-15 ENCOUNTER — Other Ambulatory Visit: Payer: 59

## 2014-07-15 DIAGNOSIS — IMO0002 Reserved for concepts with insufficient information to code with codable children: Secondary | ICD-10-CM

## 2014-07-15 DIAGNOSIS — Z803 Family history of malignant neoplasm of breast: Secondary | ICD-10-CM

## 2014-07-15 NOTE — Progress Notes (Signed)
HISTORY OF PRESENT ILLNESS: Dr. Leward Quan requested a cancer genetics consultation for [facility], a 38 y.o. female, due to a family history of breast cancer.  Ms. Burek presents to clinic today to discuss the possibility of a hereditary predisposition to cancer, genetic testing, and to further clarify her future cancer risks, as well as potential cancer risk for family members. Ms. Deschepper has no personal history of cancer.   Past Medical History  Diagnosis Date   Hypertension    Chronic headache    Allergy    Asthma     Past Surgical History  Procedure Laterality Date   Breast tumor removed  at age 89    right   Lasik  at age 68    Eye surgery      both 1998 and right 1998   Breast surgery  1999    right    History   Social History   Marital Status: Single    Spouse Name: partner    Number of Children: Y   Years of Education: N/A   Occupational History   medical hospice social worker [place] History Main Topics   Smoking status: Never Smoker    Smokeless tobacco: Not on file   Alcohol Use: Yes     Comment: occasionally wine   Drug Use: No   Sexual Activity: Yes    Museum/gallery curator: None     Comment: number of sex partners in the last 69 months 1   Other Topics Concern   Not on file   Social History Narrative   Exercise running 1 week ([employer] form) for 30 minutes. Patient has irregular menses.     FAMILY HISTORY:  During the visit, a 4-generation pedigree was obtained. Significant diagnoses include the following:  Family History  Problem Relation Age of Onset   Allergies Mother    Asthma Mother    Breast cancer Mother 54   Alzheimer's disease Mother 93    moderately severe   Hypertension Father    Cancer Paternal Grandmother     breast   Breast cancer Paternal Grandmother 79   Breast cancer Maternal Aunt 33    Stage 3 or 4 breast cancer   Breast cancer Paternal  Aunt 64    Breast cancer   Other Maternal Grandmother     died in her 70s    Ms. Taylor-Paladino's ancestry is of Caucasian descent. There is no known Jewish ancestry or consanguinity.  GENETIC COUNSELING ASSESSMENT: Ms. Lamontagne is a 38 y.o. female with a family history of cancer which is not highly suggestive of a hereditary predisposition to cancer. However, her maternal family size is small (her mother, who has breast cancer, has only the one sister who also had breast cancer) and her maternal grandmother died at a very young age. We, therefore, discussed and recommended the following at today's visit.   DISCUSSION: We reviewed the characteristics, features and inheritance patterns of hereditary cancer syndromes. We also discussed genetic testing, including the appropriate family members to test, the process of testing, insurance coverage and turn-around-time for results. We discussed the implications of a negative, positive and/or variant of uncertain significant result. Due the reasons above in combination with Ms. Taylor-Paladino's anxiety regarding the family history, we recommended Ms. Taylor-Paladino pursue genetic testing for BRCA1 and BRCA2. We discussed that a negative result would be very reassuring.  PLAN: Based on our above recommendation, Ms. Taylor-Paladino wished to pursue genetic testing and  the blood sample was drawn and will be sent to [facility] for analysis. Results should be available within approximately 3 weeks time, at which point they will be disclosed by telephone to Ms. Goupil, as will any additional recommendations warranted by these results. We also encouraged Ms. Mounger to remain in contact with cancer genetics annually so that we can continuously update the family history and inform her of any changes in cancer genetics and testing that may be of benefit for this family. Ms.  Gatz questions were answered to her  satisfaction today. Our contact information was provided should additional questions or concerns arise.   Thank you for the referral and allowing [place] to share in the care of your patient.   The patient was seen for a total of 45 minutes in face-to-face genetic counseling.  This patient was discussed with Magrinat who agrees with the above.    _______________________________________________________________________ For Office Staff:  Number of people involved in session: 2 Was an Intern/ student involved with case: not applicable

## 2014-07-21 DIAGNOSIS — Z0271 Encounter for disability determination: Secondary | ICD-10-CM

## 2014-07-31 ENCOUNTER — Encounter: Payer: Self-pay | Admitting: Genetic Counselor

## 2014-07-31 DIAGNOSIS — Z803 Family history of malignant neoplasm of breast: Secondary | ICD-10-CM

## 2014-07-31 NOTE — Progress Notes (Signed)
HPI:  Tonya Holland was previously seen in the [facility] clinic due to a family history of cancer and concerns regarding a hereditary predisposition to cancer. Please refer to our prior cancer genetics clinic note for more information regarding Tonya Holland's medical, social and family histories, and our assessment and recommendations, at the time. Tonya Holland recent genetic test results were disclosed to her, as were recommendations warranted by these results. These results and recommendations are discussed in more detail below.  GENETIC TEST RESULTS: At the time of Tonya Holland's visit, we recommended she pursue genetic testing of the BRCA1 and BRCA2 genes. This test, which included sequencing and deletion/duplication analysis of the genes, was performed at [facility]. Genetic testing was normal, and did not reveal a deleterious mutation in these genes. We discussed with Tonya Holland that since the current genetic testing is not perfect, it is possible there may be a gene mutation in one of these genes that current testing cannot detect, but that chance is small.  We also discussed, that it is possible that another gene that has not yet been discovered, or that we have not yet tested, is responsible for the cancer diagnoses in the family, and it is, therefore, important to remain in touch with cancer genetics in the future so that we can continue to offer Tonya Holland the most up to date genetic testing.   ADDITIONAL GENETIC TESTING: We discussed with Tonya Holland that there are other genes that are associated with increased cancer risk that can be analyzed. The laboratories that offer such testing look at these additional genes via a hereditary cancer gene panel. Should Tonya Holland wish to pursue such testing, we are happy to discuss and coordinate this testing at any time.    CANCER SCREENING RECOMMENDATIONS: This  normal result is reassuring and indicates that Tonya Holland does not likely have an increased risk of cancer due to a a mutation in one of these genes.  We, therefore, recommended  Tonya Holland continue to follow the cancer screening guidelines provided by her primary healthcare providers.   RECOMMENDATIONS FOR FAMILY MEMBERS: While these results are reassuring for Tonya Holland, this test does not tell us anything about Tonya Holland's maternal relatives' risks. We recommended these relatives also have genetic counseling and testing. Please let us know if we can help facilitate testing. Genetic counselors can be located in other cities, by visiting the website of the [employer] (ArtistMovie.se) and Field seismologist for a Dietitian by zip code.   FOLLOW-UP: Lastly, we discussed with Tonya Holland that cancer genetics is a rapidly advancing field and it is possible that new genetic tests will be appropriate for her and/or her family members in the future. We encouraged her to remain in contact with cancer genetics on an annual basis so we can update her personal and family histories and let her know of advances in cancer genetics that may benefit this family.   Our contact number was provided. Tonya Holland questions were answered to her satisfaction, and she knows she is welcome to call us at anytime with additional questions or concerns.   Catherine A. Fine, MS, CGC Certified Genetic Counseor [email or web address]

## 2014-09-13 ENCOUNTER — Telehealth: Payer: Self-pay | Admitting: Physician Assistant

## 2014-09-13 NOTE — Telephone Encounter (Signed)
Patient leaving for the beach on 10/14 and Xanax fill will be a little early so she can get it filled before she leaves.  I ok'd this.

## 2014-11-16 ENCOUNTER — Other Ambulatory Visit: Payer: Self-pay | Admitting: Family Medicine

## 2014-11-16 NOTE — Telephone Encounter (Signed)
Alprazolam refill phoned to pt's pharmacy. 

## 2014-12-01 ENCOUNTER — Ambulatory Visit (INDEPENDENT_AMBULATORY_CARE_PROVIDER_SITE_OTHER): Payer: 59 | Admitting: Family Medicine

## 2014-12-01 ENCOUNTER — Encounter: Payer: Self-pay | Admitting: Family Medicine

## 2014-12-01 VITALS — BP 130/78 | HR 80 | Temp 98.1°F | Resp 18 | Ht 69.0 in | Wt 147.0 lb

## 2014-12-01 DIAGNOSIS — I10 Essential (primary) hypertension: Secondary | ICD-10-CM

## 2014-12-01 DIAGNOSIS — F411 Generalized anxiety disorder: Secondary | ICD-10-CM
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
## 2014-12-01 NOTE — Progress Notes (Signed)
S:  This 38 y.o. Cauc female is here today for HTN follow-up. She is seen primarily by Jaynee Eagles, PA-C in consultation w/ me. Her medical hx is reviewed; see PA-C's note. I agree w/ documentation and have nothing else to add.  MEDICATIONS, PMHx, SURG Hx, SOC and FAM Hx reviewed.  O:  Filed Vitals:   12/01/14 1619  BP: 130/78  Pulse: 80  Temp: 98.1 F (36.7 C)  Resp: 18    I agree with physical finding as documented in M.Bess Harvest, PA-C's note. Nothing to add.  A/P: Essential hypertension- Stable and controlled on current medication and lifestyle modifications.  Generalized anxiety disorder- Continue current treatment and counseling (perhaps discuss emotional eating and triggers). Will contact clinic for medication refills when appropriate.  Barton Fanny, MD [facility]

## 2015-01-24 ENCOUNTER — Other Ambulatory Visit: Payer: Self-pay | Admitting: Family Medicine

## 2015-01-26 ENCOUNTER — Telehealth: Payer: Self-pay

## 2015-01-26 ENCOUNTER — Ambulatory Visit: Payer: 59

## 2015-01-26 DIAGNOSIS — F4323 Adjustment disorder with mixed anxiety and depressed mood: Secondary | ICD-10-CM

## 2015-01-26 NOTE — Telephone Encounter (Signed)
Alprazolam refill phoned to pt's pharmacy. 

## 2015-01-26 NOTE — Telephone Encounter (Signed)
Patient is extremely upset because she came in on Sunday and could not get the RX she needed. She was told to have her pharmacy request it. We received a request on 01/24/15 and I did advise we do not process refills on the weekends. I did reassure patient that the request was received. Patient demands that we do something immediately and this happens to her every time and she wasted her copay coming here for a rx that she could not get. Transferred patient to [employer].

## 2015-01-27 MED ORDER — BUSPIRONE HCL 5 MG PO TABS: ORAL_TABLET | ORAL | Status: DC

## 2015-01-27 MED ORDER — FLUTICASONE PROPIONATE 50 MCG/ACT NA SUSP: 2.0000 | NASAL | Status: AC | PRN

## 2015-01-27 MED ORDER — LOSARTAN POTASSIUM 50 MG PO TABS: 50.0000 mg | ORAL_TABLET | Freq: Every day | ORAL | Status: DC

## 2015-01-27 MED ORDER — CITALOPRAM HYDROBROMIDE 20 MG PO TABS: 20.0000 mg | ORAL_TABLET | Freq: Every day | ORAL | Status: DC

## 2015-01-27 MED ORDER — FLUTICASONE PROPIONATE 50 MCG/ACT NA SUSP: 2.0000 | NASAL | Status: DC | PRN

## 2015-01-27 NOTE — Telephone Encounter (Signed)
Attempted to call pt. Left VM informing her that it will be up to Dr. Leward Quan as to whether she will need an OV for her rx's. Pt says she wasted copay this weekend, but I see no OV from the past weekend. Her last OV was was 12/29 so I'm not sure why she would have paid a co-pay. Pt had an appt with Dr. Leward Quan on 01/26/15, but cancelled and re-scheduled for 06/02/15.

## 2015-01-27 NOTE — Telephone Encounter (Signed)
Sent in all of chronic meds that Dr Leward Quan Rxs (other than alprazolam that was faxed last night). Notified pt on VM which meds I was sending in and that Alprazolam was sent last night. Asked for CB if there were any other meds or problems.

## 2015-03-31 ENCOUNTER — Other Ambulatory Visit: Payer: Self-pay | Admitting: Family Medicine

## 2015-05-18 ENCOUNTER — Encounter: Payer: Self-pay | Admitting: Family Medicine

## 2015-05-31 ENCOUNTER — Ambulatory Visit (INDEPENDENT_AMBULATORY_CARE_PROVIDER_SITE_OTHER): Payer: 59 | Admitting: Physician Assistant

## 2015-05-31 ENCOUNTER — Encounter: Payer: Self-pay | Admitting: Physician Assistant

## 2015-05-31 VITALS — BP 125/71 | HR 69 | Temp 98.3°F | Resp 16 | Ht 68.5 in | Wt 149.4 lb

## 2015-05-31 DIAGNOSIS — I10 Essential (primary) hypertension: Secondary | ICD-10-CM | POA: Diagnosis not present

## 2015-05-31 DIAGNOSIS — F411 Generalized anxiety disorder: Secondary | ICD-10-CM | POA: Diagnosis not present

## 2015-05-31 MED ORDER — LOSARTAN POTASSIUM 50 MG PO TABS
50.0000 mg | ORAL_TABLET | Freq: Every day | ORAL | Status: DC
Start: 2015-05-31 — End: 2015-12-28

## 2015-05-31 MED ORDER — CITALOPRAM HYDROBROMIDE 20 MG PO TABS: 20.0000 mg | ORAL_TABLET | Freq: Every day | ORAL | Status: DC

## 2015-05-31 MED ORDER — BUSPIRONE HCL 5 MG PO TABS: ORAL_TABLET | ORAL | Status: DC

## 2015-05-31 MED ORDER — ALPRAZOLAM 0.5 MG PO TABS: 0.5000 mg | ORAL_TABLET | Freq: Every day | ORAL | Status: DC | PRN
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
## 2015-06-02 ENCOUNTER — Encounter: Payer: Self-pay | Admitting: Family Medicine

## 2015-08-23 ENCOUNTER — Telehealth: Payer: Self-pay | Admitting: Family Medicine

## 2015-08-23 NOTE — Telephone Encounter (Signed)
lmom to call and reschedule her appt that she had 11-15-15 with Nicola Girt

## 2015-09-23 ENCOUNTER — Other Ambulatory Visit: Payer: Self-pay | Admitting: Physician Assistant

## 2015-09-24 NOTE — Telephone Encounter (Signed)
Faxed

## 2015-11-05 ENCOUNTER — Ambulatory Visit (INDEPENDENT_AMBULATORY_CARE_PROVIDER_SITE_OTHER): Payer: 59

## 2015-11-05 ENCOUNTER — Ambulatory Visit (INDEPENDENT_AMBULATORY_CARE_PROVIDER_SITE_OTHER): Payer: Managed Care, Other (non HMO) | Admitting: Emergency Medicine

## 2015-11-05 VITALS — BP 124/80 | HR 85 | Temp 98.2°F | Resp 17 | Ht 68.5 in | Wt 164.0 lb

## 2015-11-05 DIAGNOSIS — R05 Cough: Secondary | ICD-10-CM

## 2015-11-05 DIAGNOSIS — J209 Acute bronchitis, unspecified: Secondary | ICD-10-CM

## 2015-11-05 DIAGNOSIS — R059 Cough, unspecified: Secondary | ICD-10-CM

## 2015-11-05 LAB — POCT CBC
GRANULOCYTE PERCENT: 75.4 % (ref 37–80)
HCT, POC: 41.9 % (ref 37.7–47.9)
Hemoglobin: 14.3 g/dL (ref 12.2–16.2)
LYMPH, POC: 1.9 (ref 0.6–3.4)
MCH, POC: 29.7 pg (ref 27–31.2)
MCHC: 34.2 g/dL (ref 31.8–35.4)
MCV: 86.8 fL (ref 80–97)
MID (CBC): 0.2 (ref 0–0.9)
MPV: 7.9 fL (ref 0–99.8)
PLATELET COUNT, POC: 289 10*3/uL (ref 142–424)
POC GRANULOCYTE: 6.7 (ref 2–6.9)
POC LYMPH %: 21.9 % (ref 10–50)
POC MID %: 2.7 %M (ref 0–12)
RBC: 4.83 M/uL (ref 4.04–5.48)
RDW, POC: 13.4 %
WBC: 8.9 10*3/uL (ref 4.6–10.2)

## 2015-11-05 MED ORDER — ALBUTEROL SULFATE HFA 108 (90 BASE) MCG/ACT IN AERS: 2.0000 | INHALATION_SPRAY | RESPIRATORY_TRACT | Status: DC | PRN

## 2015-11-05 MED ORDER — ALBUTEROL SULFATE (2.5 MG/3ML) 0.083% IN NEBU
2.5000 mg | INHALATION_SOLUTION | Freq: Once | RESPIRATORY_TRACT | Status: AC
Start: 2015-11-05 — End: 2015-11-05
  Administered 2015-11-05: 2.5 mg via RESPIRATORY_TRACT

## 2015-11-05 MED ORDER — DOXYCYCLINE HYCLATE 100 MG PO CAPS: 100.0000 mg | ORAL_CAPSULE | Freq: Two times a day (BID) | ORAL | Status: DC

## 2015-11-05 MED ORDER — BENZONATATE 100 MG PO CAPS: 100.0000 mg | ORAL_CAPSULE | Freq: Three times a day (TID) | ORAL | Status: DC | PRN

## 2015-11-05 NOTE — Progress Notes (Signed)
Chief Complaint:  Chief Complaint  Patient presents with  . Cough  . Shortness of Breath  . Nasal Congestion    HPI: Tonya Holland is a 39 y.o. female who is here for sore throat and cough that started 2 weeks ago today. She also states that she had a fever, loss of appetite, and cough. She went to a doctor was prescribed Tessalon pearls. Her cough is terrible at night. She denies hearing any wheezing. At the beginning of the cough 2 weeks ago, she had green phlegm. Now, she is not coughing up phlegm. She denies being around sick coworkers or sick children in the home.   Past Medical History  Diagnosis Date  . Hypertension   . Chronic headache   . Allergy   . Asthma    Past Surgical History  Procedure Laterality Date  . Breast tumor removed  at age 39    right  . Lasik  at age 1   . Eye surgery      both 1998 and right 1998  . Breast surgery  1999    right   Social History   Social History  . Marital Status: Single    Spouse Name: partner  . Number of Children: Y  . Years of Education: N/A   Occupational History  . medical hospice social worker [employer] History Main Topics  . Smoking status: Never Smoker   . Smokeless tobacco: None  . Alcohol Use: Yes     Comment: occasionally wine  . Drug Use: No  . Sexual Activity: Yes    Birth Control/ Protection: None     Comment: number of sex partners in the last 75 months 1   Other Topics Concern  . None   Social History Narrative   Exercise running 1 week ([employer]) for 30 minutes. Patient has irregular menses.   Family History  Problem Relation Age of Onset  . Allergies Mother   . Asthma Mother   . Breast cancer Mother 52  . Alzheimer's disease Mother 56    moderately severe  . Hypertension Father   . Cancer Paternal Grandmother     breast  . Breast cancer Paternal Grandmother 13  . Breast cancer Maternal Aunt 60    Stage 3 or 4 breast cancer  . Breast cancer  Paternal Aunt 80    Breast cancer  . Other Maternal Grandmother     died in her 15s   Allergies  Allergen Reactions  . Ace Inhibitors   . Penicillins     childhood   Prior to Admission medications   Medication Sig Start Date End Date Taking? Authorizing Provider  ALPRAZolam Duanne Moron) 0.5 MG tablet TAKE 1 TABLET EVERY DAY AS NEEDED FOR ANXIETY 09/24/15  Yes Tishira R Brewington, PA-C  busPIRone (BUSPAR) 5 MG tablet Take 1 tablet twice daily for anxiety. 05/31/15  Yes Tishira R Brewington, PA-C  cetirizine (ZYRTEC) 10 MG tablet Take 10 mg by mouth as needed.   Yes Historical Provider, MD  citalopram (CELEXA) 20 MG tablet Take 1 tablet (20 mg total) by mouth daily. 05/31/15  Yes Tishira R Brewington, PA-C  estradiol-norethindrone (COMBIPATCH) 0.05-0.14 MG/DAY Place 1 patch onto the skin 2 (two) times a week.   Yes Historical Provider, MD  fluticasone (FLONASE) 50 MCG/ACT nasal spray Place 2 sprays into both nostrils as needed. 01/27/15  Yes Barton Fanny, MD  losartan (COZAAR) 50 MG tablet Take  1 tablet (50 mg total) by mouth daily. 05/31/15  Yes Tishira R Brewington, PA-C  Melatonin-Pyridoxine (MELATIN PO) Take by mouth daily.   Yes Historical Provider, MD  naproxen (NAPROSYN) 500 MG tablet Take 1 tablet (500 mg total) by mouth as needed. 06/04/12  Yes Barton Fanny, MD  vitamin C (ASCORBIC ACID) 500 MG tablet Take 500 mg by mouth as needed.   Yes Historical Provider, MD  clindamycin (CLEOCIN) 150 MG capsule Take 1 capsule twice a day for 1 week. Patient not taking: Reported on 11/05/2015 05/29/14   Barton Fanny, MD     ROS: The patient denies fevers, chills, night sweats, unintentional weight loss, chest pain, palpitations, wheezing, dyspnea on exertion, nausea, vomiting, abdominal pain, dysuria, hematuria, melena, numbness, weakness, or tingling.   All other systems have been reviewed and were otherwise negative with the exception of those mentioned in the HPI and as above.     PHYSICAL EXAM: Filed Vitals:   11/05/15 1549  BP: 124/80  Pulse: 85  Temp: 98.2 F (36.8 C)  Resp: 17   Filed Vitals:   11/05/15 1549  Height: 5' 8.5" (1.74 m)  Weight: 164 lb (74.39 kg)   Body mass index is 24.57 kg/(m^2).   General: Alert, no acute distress HEENT:  Normocephalic, atraumatic, oropharynx patent. EOMI, PERRLA Cardiovascular:  Regular rate and rhythm, no rubs murmurs or gallops.  No Carotid bruits, radial pulse intact. No pedal edema.  Respiratory: Clear to auscultation bilaterally.  No wheezes, rales, or rhonchi.  No cyanosis, no use of accessory musculature GI: No organomegaly, abdomen is soft and non-tender, positive bowel sounds.  No masses. Skin: No rashes. Neurologic: Facial musculature symmetric. Psychiatric: Patient is appropriate throughout our interaction. Lymphatic: No cervical lymphadenopathy Musculoskeletal: Gait intact.   LABS: Results for orders placed or performed in visit on 11/05/15  POCT CBC  Result Value Ref Range   WBC 8.9 4.6 - 10.2 K/uL   Lymph, poc 1.9 0.6 - 3.4   POC LYMPH PERCENT 21.9 10 - 50 %L   MID (cbc) 0.2 0 - 0.9   POC MID % 2.7 0 - 12 %M   POC Granulocyte 6.7 2 - 6.9   Granulocyte percent 75.4 37 - 80 %G   RBC 4.83 4.04 - 5.48 M/uL   Hemoglobin 14.3 12.2 - 16.2 g/dL   HCT, POC 41.9 37.7 - 47.9 %   MCV 86.8 80 - 97 fL   MCH, POC 29.7 27 - 31.2 pg   MCHC 34.2 31.8 - 35.4 g/dL   RDW, POC 13.4 %   Platelet Count, POC 289 142 - 424 K/uL   MPV 7.9 0 - 99.8 fL   PEAK FLOW 270 pos 300  ttreatment repeat 300 Meds ordered this encounter  Medications  . albuterol (PROVENTIL HFA;VENTOLIN HFA) 108 (90 BASE) MCG/ACT inhaler    Sig: Inhale 2 puffs into the lungs every 4 (four) hours as needed for wheezing or shortness of breath (cough, shortness of breath or wheezing.).    Dispense:  1 Inhaler    Refill:  1  . doxycycline (VIBRAMYCIN) 100 MG capsule    Sig: Take 1 capsule (100 mg total) by mouth 2 (two) times daily.     Dispense:  20 capsule    Refill:  0  . albuterol (PROVENTIL) (2.5 MG/3ML) 0.083% nebulizer solution 2.5 mg    Sig:   . benzonatate (TESSALON) 100 MG capsule    Sig: Take 1-2 capsules (100-200 mg total) by  mouth 3 (three) times daily as needed for cough.    Dispense:  40 capsule    Refill:  0   EKG/XRAY:   Primary read interpreted by Dr. Everlene Farrier at [facility]. NAD   ASSESSMENT/PLAN: Will treat with a doxycycline and albuterol inhaler. Will try to avoid steroids if possible. I personally performed the services described in this documentation, which was scribed in my presence. The recorded information has been reviewed and is accurate. Her Tessalon Perles were also refilled. Gross sideeffects, risk and benefits, and alternatives of medications d/w patient. Patient is aware that all medications have potential sideeffects and we are unable to predict every sideeffect or drug-drug interaction that may occur.  @[facility] @ 11/05/2015 4:25 PM

## 2015-11-15 ENCOUNTER — Ambulatory Visit: Payer: 59 | Admitting: Physician Assistant

## 2015-11-17 ENCOUNTER — Ambulatory Visit: Payer: 59 | Admitting: Physician Assistant

## 2015-12-28 ENCOUNTER — Ambulatory Visit (INDEPENDENT_AMBULATORY_CARE_PROVIDER_SITE_OTHER): Payer: Managed Care, Other (non HMO) | Admitting: Physician Assistant

## 2015-12-28 ENCOUNTER — Encounter: Payer: Self-pay | Admitting: Physician Assistant

## 2015-12-28 VITALS — BP 130/76 | HR 93 | Temp 98.1°F | Resp 16 | Ht 68.5 in | Wt 163.2 lb

## 2015-12-28 DIAGNOSIS — F419 Anxiety disorder, unspecified: Secondary | ICD-10-CM | POA: Diagnosis not present

## 2015-12-28 DIAGNOSIS — I10 Essential (primary) hypertension: Secondary | ICD-10-CM | POA: Diagnosis not present

## 2015-12-28 DIAGNOSIS — F411 Generalized anxiety disorder: Secondary | ICD-10-CM | POA: Diagnosis not present

## 2015-12-28 MED ORDER — LOSARTAN POTASSIUM 50 MG PO TABS: 50.0000 mg | ORAL_TABLET | Freq: Every day | ORAL | Status: DC

## 2015-12-28 MED ORDER — BUSPIRONE HCL 15 MG PO TABS: ORAL_TABLET | ORAL | Status: DC
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
## 2015-12-28 NOTE — Patient Instructions (Addendum)
You can increase the Buspar to 7.5mg  twice daily (half of the 15mg  tablet). Reassess after 5-7 days. After that time you can increase to 10mg  twice daily (2/3 of the 15mg  tablet).

## 2016-01-06 ENCOUNTER — Other Ambulatory Visit: Payer: Self-pay

## 2016-01-06 DIAGNOSIS — F411 Generalized anxiety disorder: Secondary | ICD-10-CM

## 2016-01-06 MED ORDER — CITALOPRAM HYDROBROMIDE 20 MG PO TABS: 20.0000 mg | ORAL_TABLET | Freq: Every day | ORAL | Status: DC

## 2016-01-31 ENCOUNTER — Other Ambulatory Visit: Payer: Self-pay

## 2016-01-31 NOTE — Telephone Encounter (Signed)
Pharm reqs RF of alprazolam. Chelle, Tishira last Rxd this for pt, but she saw you for anxiety in Jan w/ f/up planned for July.

## 2016-02-01 MED ORDER — ALPRAZOLAM 0.5 MG PO TABS: ORAL_TABLET | ORAL | Status: DC

## 2016-02-01 NOTE — Telephone Encounter (Signed)
Rx printed at 104. Will bring to 102 after clinic.  Meds ordered this encounter  Medications  . ALPRAZolam (XANAX) 0.5 MG tablet    Sig: TAKE 1 TABLET EVERY DAY AS NEEDED FOR ANXIETY    Dispense:  30 tablet    Refill:  0    Not to exceed 3 additional fills before 11/27/2015.

## 2016-02-03 ENCOUNTER — Other Ambulatory Visit: Payer: Self-pay

## 2016-02-03 NOTE — Telephone Encounter (Signed)
Faxed

## 2016-04-25 ENCOUNTER — Other Ambulatory Visit: Payer: Self-pay | Admitting: Physician Assistant

## 2016-04-27 ENCOUNTER — Telehealth: Payer: Self-pay

## 2016-04-27 MED ORDER — ALPRAZOLAM 0.5 MG PO TABS: ORAL_TABLET | ORAL | Status: DC

## 2016-04-27 NOTE — Telephone Encounter (Signed)
PT. Called in requesting xanax refill

## 2016-04-27 NOTE — Telephone Encounter (Signed)
[facility] called again wanting to know status of RX refill. I told them it was still pending.

## 2016-04-27 NOTE — Addendum Note (Signed)
Addended by: Fara Chute on: 04/27/2016 08:31 PM   Modules accepted: Orders

## 2016-04-27 NOTE — Telephone Encounter (Signed)
Please remind patient that she is due for follow-up and fasting labs in July.  Meds ordered this encounter  Medications  . ALPRAZolam (XANAX) 0.5 MG tablet    Sig: TAKE 1 TABLET EVERY DAY AS NEEDED FOR ANXIETY    Dispense:  30 tablet    Refill:  0    Not to exceed 3 additional fills before 11/27/2015.    Order Specific Question:  Supervising Provider    Answer:  Tami Lin P [id number]

## 2016-04-28 NOTE — Telephone Encounter (Signed)
Faxed RF and notified pt on VM of need for f/up/labs in July.

## 2016-06-27 ENCOUNTER — Encounter: Payer: Self-pay | Admitting: Physician Assistant

## 2016-06-27 ENCOUNTER — Ambulatory Visit (INDEPENDENT_AMBULATORY_CARE_PROVIDER_SITE_OTHER): Payer: Managed Care, Other (non HMO) | Admitting: Physician Assistant

## 2016-06-27 VITALS — BP 120/80 | HR 94 | Temp 99.1°F | Resp 16 | Ht 68.5 in | Wt 165.6 lb

## 2016-06-27 DIAGNOSIS — F419 Anxiety disorder, unspecified: Secondary | ICD-10-CM

## 2016-06-27 DIAGNOSIS — Z114 Encounter for screening for human immunodeficiency virus [HIV]: Secondary | ICD-10-CM

## 2016-06-27 DIAGNOSIS — Z13220 Encounter for screening for lipoid disorders: Secondary | ICD-10-CM

## 2016-06-27 DIAGNOSIS — I10 Essential (primary) hypertension: Secondary | ICD-10-CM

## 2016-06-27 DIAGNOSIS — F411 Generalized anxiety disorder: Secondary | ICD-10-CM | POA: Diagnosis not present

## 2016-06-27 MED ORDER — LOSARTAN POTASSIUM 50 MG PO TABS: 50.0000 mg | ORAL_TABLET | Freq: Every day | ORAL | 3 refills | Status: DC

## 2016-06-27 MED ORDER — CITALOPRAM HYDROBROMIDE 20 MG PO TABS: 20.0000 mg | ORAL_TABLET | Freq: Every day | ORAL | 3 refills | Status: DC

## 2016-06-27 MED ORDER — BUSPIRONE HCL 15 MG PO TABS: ORAL_TABLET | ORAL | 3 refills | Status: DC

## 2016-06-27 MED ORDER — ALPRAZOLAM 0.5 MG PO TABS: ORAL_TABLET | ORAL | 0 refills | Status: DC

## 2016-06-27 NOTE — Progress Notes (Signed)
Patient ID: Tonya Holland, female    DOB: 1976-07-15, 40 y.o.   MRN: [id number]  PCP: Harrison Mons, PA-C  Subjective:   Chief Complaint  Patient presents with  . Other    medication  refill, Xanax, buspar, Citalopram 20 mg, Losartan 50 mg  . Other    bloodwork    HPI Presents for medication refills.  Has increased the buspirone dose up to 15 mg BID and is tolerating it well. Continues citalopram as before. Has been been able to reduce alprazolam use. Anxiety while driving has improved.  Tolerating losartan without adverse effects. No CP, SOB, HA, dizziness.  Review of Systems  Constitutional: Negative.   HENT: Negative for sore throat.   Eyes: Negative for visual disturbance.  Respiratory: Negative for cough, chest tightness, shortness of breath and wheezing.   Cardiovascular: Negative for chest pain and palpitations.  Gastrointestinal: Negative for abdominal pain, diarrhea, nausea and vomiting.  Genitourinary: Negative for dysuria, frequency, hematuria and urgency.  Musculoskeletal: Negative for arthralgias and myalgias.  Skin: Negative for rash.  Neurological: Negative for dizziness, weakness and headaches.  Psychiatric/Behavioral: Negative for decreased concentration, dysphoric mood, self-injury, sleep disturbance and suicidal ideas. The patient is nervous/anxious.        Patient Active Problem List   Diagnosis Date Noted  . Family history of malignant neoplasm of breast 07/15/2014  . Seasonal allergies 05/15/2013  . Multiple nevi 05/15/2013  . Anxiety 03/01/2012  . Cough 01/11/2012  . Hypertension 01/11/2012     Prior to Admission medications   Medication Sig Start Date End Date Taking? Authorizing Provider  albuterol (PROVENTIL HFA;VENTOLIN HFA) 108 (90 BASE) MCG/ACT inhaler Inhale 2 puffs into the lungs every 4 (four) hours as needed for wheezing or shortness of breath (cough, shortness of breath or wheezing.). 11/05/15  Yes Darlyne Russian, MD    ALPRAZolam Duanne Moron) 0.5 MG tablet TAKE 1 TABLET EVERY DAY AS NEEDED FOR ANXIETY 04/27/16  Yes Xia Stohr, PA-C  busPIRone (BUSPAR) 15 MG tablet Increase total daily dose by 5 mg every 5-7 days up to 15 mg BID. 12/28/15  Yes Lajean Boese, PA-C  Calcium Carbonate (CALCI-CHEW PO) Take by mouth.   Yes Historical Provider, MD  cetirizine (ZYRTEC) 10 MG tablet Take 10 mg by mouth as needed.   Yes Historical Provider, MD  citalopram (CELEXA) 20 MG tablet Take 1 tablet (20 mg total) by mouth daily. 01/06/16  Yes Amarise Lillo, PA-C  fluticasone (FLONASE) 50 MCG/ACT nasal spray Place 2 sprays into both nostrils as needed. 01/27/15  Yes Barton Fanny, MD  losartan (COZAAR) 50 MG tablet Take 1 tablet (50 mg total) by mouth daily. 12/28/15  Yes Jyll Tomaro, PA-C  Melatonin-Pyridoxine (MELATIN PO) Take by mouth daily.   Yes Historical Provider, MD  naproxen (NAPROSYN) 500 MG tablet Take 1 tablet (500 mg total) by mouth as needed. 06/04/12  Yes Barton Fanny, MD  Norethin Ace-Eth Estrad-FE (BLISOVI 24 FE PO) Take by mouth.   Yes Historical Provider, MD  vitamin C (ASCORBIC ACID) 500 MG tablet Take 500 mg by mouth as needed.   Yes Historical Provider, MD  estradiol-levonorgestrel [facility]) 0.045-0.015 MG/DAY Place 1 patch onto the skin once a week.    Historical Provider, MD  estradiol-norethindrone [facility]) 0.05-0.14 MG/DAY Place 1 patch onto the skin 2 (two) times a week. Reported on 12/28/2015    Historical Provider, MD     Allergies  Allergen Reactions  . Ace Inhibitors   . Penicillins  childhood       Objective:  Physical Exam  Constitutional: She is oriented to person, place, and time. She appears well-developed and well-nourished. She is active and cooperative. No distress.  BP 120/80 (BP Location: Right Arm, Patient Position: Sitting, Cuff Size: Normal)   Pulse 94   Temp 99.1 F (37.3 C) (Oral)   Resp 16   Ht 5' 8.5" (1.74 m)   Wt 165 lb 9.6 oz (75.1 kg)   LMP   (Approximate)   SpO2 98%   BMI 24.81 kg/m   HENT:  Head: Normocephalic and atraumatic.  Right Ear: Hearing normal.  Left Ear: Hearing normal.  Eyes: Conjunctivae are normal. No scleral icterus.  Neck: Normal range of motion. Neck supple. No thyromegaly present.  Cardiovascular: Normal rate, regular rhythm and normal heart sounds.   Pulses:      Radial pulses are 2+ on the right side, and 2+ on the left side.  Pulmonary/Chest: Effort normal and breath sounds normal.  Lymphadenopathy:       Head (right side): No tonsillar, no preauricular, no posterior auricular and no occipital adenopathy present.       Head (left side): No tonsillar, no preauricular, no posterior auricular and no occipital adenopathy present.    She has no cervical adenopathy.       Right: No supraclavicular adenopathy present.       Left: No supraclavicular adenopathy present.  Neurological: She is alert and oriented to person, place, and time. No sensory deficit.  Skin: Skin is warm, dry and intact. No rash noted. No cyanosis or erythema. Nails show no clubbing.  Psychiatric: She has a normal mood and affect. Her speech is normal and behavior is normal.           Assessment & Plan:   1. Essential hypertension Controlled. Continue current treatment. - CBC with Differential/Platelet - Comprehensive metabolic panel - TSH - VITAMIN D 25 Hydroxy (Vit-D Deficiency, Fractures) - losartan (COZAAR) 50 MG tablet; Take 1 tablet (50 mg total) by mouth daily.  Dispense: 90 tablet; Refill: 3  2. Generalized anxiety disorder Improved. Continue citalopram and buspirone. We can increase the buspirone dose when she is ready. Continue to try to minimize the alprazolam use. - citalopram (CELEXA) 20 MG tablet; Take 1 tablet (20 mg total) by mouth daily.  Dispense: 90 tablet; Refill: 3 - busPIRone (BUSPAR) 15 MG tablet; Increase total daily dose by 5 mg every 5-7 days up to 15 mg BID.  Dispense: 180 tablet; Refill: 3 -  ALPRAZolam (XANAX) 0.5 MG tablet; TAKE 1 TABLET EVERY DAY AS NEEDED FOR ANXIETY  Dispense: 30 tablet; Refill: 0  3. Screening for HIV (human immunodeficiency virus) - HIV antibody  4. Screening for hyperlipidemia - Lipid panel  Return in about 6 months (around 12/28/2016) for re-evaluation of blood pressure and anxiety.    Fara Chute, PA-C Physician Assistant-Certified [facility]

## 2016-06-27 NOTE — Patient Instructions (Addendum)
Keep up the great work of being the healthiest you!    IF you received an x-ray today, you will receive an invoice from [facility]. Please contact [facility] at [phone] with questions or concerns regarding your invoice.   IF you received labwork today, you will receive an invoice from [employer]. Please contact [facility] at [phone] with questions or concerns regarding your invoice.   Our billing staff will not be able to assist you with questions regarding bills from these companies.  You will be contacted with the lab results as soon as they are available. The fastest way to get your results is to activate your My Chart account. Instructions are located on the last page of this paperwork. If you have not heard from [place] regarding the results in 2 weeks, please contact this office.

## 2016-06-28 LAB — COMPREHENSIVE METABOLIC PANEL
ALBUMIN: 4 g/dL (ref 3.6–5.1)
ALK PHOS: 43 U/L (ref 33–115)
ALT: 16 U/L (ref 6–29)
AST: 13 U/L (ref 10–30)
BILIRUBIN TOTAL: 0.4 mg/dL (ref 0.2–1.2)
BUN: 11 mg/dL (ref 7–25)
CO2: 27 mmol/L (ref 20–31)
CREATININE: 0.69 mg/dL (ref 0.50–1.10)
Calcium: 9.1 mg/dL (ref 8.6–10.2)
Chloride: 104 mmol/L (ref 98–110)
Glucose, Bld: 86 mg/dL (ref 65–99)
Potassium: 4.2 mmol/L (ref 3.5–5.3)
SODIUM: 139 mmol/L (ref 135–146)
TOTAL PROTEIN: 6.4 g/dL (ref 6.1–8.1)

## 2016-06-28 LAB — LIPID PANEL
Cholesterol: 147 mg/dL (ref 125–200)
HDL: 53 mg/dL (ref >=46)
LDL CALC: 74 mg/dL (ref <130)
Total CHOL/HDL Ratio: 2.8 Ratio (ref <=5.0)
Triglycerides: 99 mg/dL (ref <150)
VLDL: 20 mg/dL (ref <30)

## 2016-06-28 LAB — CBC WITH DIFFERENTIAL/PLATELET
BASOS PCT: 0 %
Basophils Absolute: 0 cells/uL (ref 0–200)
EOS ABS: 74 {cells}/uL (ref 15–500)
EOS PCT: 1 %
HCT: 42.9 % (ref 35.0–45.0)
HEMOGLOBIN: 14.6 g/dL (ref 11.7–15.5)
LYMPHS ABS: 1702 {cells}/uL (ref 850–3900)
Lymphocytes Relative: 23 %
MCH: 31.1 pg (ref 27.0–33.0)
MCHC: 34 g/dL (ref 32.0–36.0)
MCV: 91.3 fL (ref 80.0–100.0)
MONOS PCT: 5 %
MPV: 10.6 fL (ref 7.5–12.5)
Monocytes Absolute: 370 cells/uL (ref 200–950)
NEUTROS ABS: 5254 {cells}/uL (ref 1500–7800)
Neutrophils Relative %: 71 %
PLATELETS: 275 10*3/uL (ref 140–400)
RBC: 4.7 MIL/uL (ref 3.80–5.10)
RDW: 13 % (ref 11.0–15.0)
WBC: 7.4 10*3/uL (ref 3.8–10.8)

## 2016-06-28 LAB — HIV ANTIBODY (ROUTINE TESTING W REFLEX): HIV: NONREACTIVE

## 2016-06-28 LAB — TSH: TSH: 1.5 mIU/L

## 2016-06-29 LAB — VITAMIN D 25 HYDROXY (VIT D DEFICIENCY, FRACTURES): VIT D 25 HYDROXY: 34 ng/mL (ref 30–100)

## 2016-11-17 ENCOUNTER — Other Ambulatory Visit: Payer: Self-pay | Admitting: Physician Assistant

## 2016-11-17 DIAGNOSIS — F411 Generalized anxiety disorder: Secondary | ICD-10-CM

## 2016-11-20 ENCOUNTER — Telehealth: Payer: Self-pay | Admitting: Emergency Medicine

## 2016-11-20 NOTE — Telephone Encounter (Signed)
Meds ordered this encounter  Medications  . ALPRAZolam (XANAX) 0.5 MG tablet    Sig: TAKE 1 TABLET BY MOUTH EVERY DAY AS NEEDED FOR ANXIETY    Dispense:  30 tablet    Refill:  0    Not to exceed 5 additional fills before 12/24/2016

## 2016-11-20 NOTE — Telephone Encounter (Signed)
Last seen for anxiety 7/25 and given Rx for #30 NRF then.

## 2016-11-20 NOTE — Telephone Encounter (Signed)
Left message Xanax is ready for pick up at pharmacy

## 2016-11-21 NOTE — Telephone Encounter (Signed)
[facility] 

## 2017-02-01 ENCOUNTER — Other Ambulatory Visit: Payer: Self-pay | Admitting: Physician Assistant

## 2017-02-01 DIAGNOSIS — F411 Generalized anxiety disorder: Secondary | ICD-10-CM

## 2017-02-03 NOTE — Telephone Encounter (Signed)
11/2016 last refill 

## 2017-02-05 NOTE — Telephone Encounter (Signed)
Meds ordered this encounter  Medications  . ALPRAZolam (XANAX) 0.5 MG tablet    Sig: TAKE 1 TABLET BY MOUTH EVERY DAY AS NEEDED FOR ANXIETY    Dispense:  30 tablet    Refill:  0    This request is for a new prescription for a controlled substance as required by Federal/State law.

## 2017-02-05 NOTE — Telephone Encounter (Signed)
[facility] 

## 2017-04-06 ENCOUNTER — Other Ambulatory Visit: Payer: Self-pay | Admitting: Physician Assistant

## 2017-04-06 DIAGNOSIS — F411 Generalized anxiety disorder: Secondary | ICD-10-CM

## 2017-04-07 NOTE — Telephone Encounter (Signed)
02/05/17 last refill 06/27/16 last ov

## 2017-04-09 NOTE — Telephone Encounter (Signed)
[facility] about appt.

## 2017-04-09 NOTE — Telephone Encounter (Signed)
Rx printed. Please advise patient that she needs OV with me for additional fills.  Meds ordered this encounter  Medications  . ALPRAZolam (XANAX) 0.5 MG tablet    Sig: TAKE 1 TABLET EVERY DAY AS NEEDED FOR ANXIETY    Dispense:  30 tablet    Refill:  0    Not to exceed 5 additional fills before 08/04/2017

## 2017-05-15 ENCOUNTER — Encounter: Payer: Self-pay | Admitting: Physician Assistant

## 2017-05-15 ENCOUNTER — Ambulatory Visit (INDEPENDENT_AMBULATORY_CARE_PROVIDER_SITE_OTHER): Payer: BLUE CROSS/BLUE SHIELD | Admitting: Physician Assistant

## 2017-05-15 VITALS — BP 120/70 | HR 106 | Temp 98.1°F | Ht 68.5 in | Wt 167.0 lb

## 2017-05-15 DIAGNOSIS — F411 Generalized anxiety disorder: Secondary | ICD-10-CM

## 2017-05-15 DIAGNOSIS — I10 Essential (primary) hypertension: Secondary | ICD-10-CM | POA: Diagnosis not present

## 2017-05-15 LAB — POCT URINALYSIS DIP (MANUAL ENTRY)
BILIRUBIN UA: NEGATIVE
BILIRUBIN UA: NEGATIVE mg/dL
Blood, UA: NEGATIVE
GLUCOSE UA: NEGATIVE mg/dL
Leukocytes, UA: NEGATIVE
Nitrite, UA: NEGATIVE
Protein Ur, POC: NEGATIVE mg/dL
SPEC GRAV UA: 1.02 (ref 1.010–1.025)
Specific Gravity: 6
UROBILINOGEN UA: 0.2 U/dL

## 2017-05-15 MED ORDER — LOSARTAN POTASSIUM 50 MG PO TABS: 50.0000 mg | ORAL_TABLET | Freq: Every day | ORAL | 3 refills | Status: DC

## 2017-05-15 MED ORDER — ALPRAZOLAM 0.5 MG PO TABS: ORAL_TABLET | ORAL | 0 refills | Status: DC

## 2017-05-15 MED ORDER — BUSPIRONE HCL 15 MG PO TABS: ORAL_TABLET | ORAL | 3 refills | Status: DC

## 2017-05-15 MED ORDER — CITALOPRAM HYDROBROMIDE 20 MG PO TABS: 20.0000 mg | ORAL_TABLET | Freq: Every day | ORAL | 3 refills | Status: DC

## 2017-05-15 NOTE — Progress Notes (Signed)
Subjective:    Patient ID: Tonya Holland, female    DOB: Aug 08, 1976, 41 y.o.   MRN: [id number] PCP: Harrison Mons, PA-C Chief Complaint  Patient presents with  . Medication Refill    xanax, buspar, celexa, losartan potassium    HPI 41 y/o F presents today for medication refills. She is doing well. She currently takes xanax and buspar for anxiety. Celexa for depression. Feels like the medications are working well at current doses.   Hypertension controlled with cozaar. Denies HA, vision changes, lightheadedness, dizziness, syncope.   Patient Active Problem List   Diagnosis Date Noted  . Family history of malignant neoplasm of breast 07/15/2014  . Seasonal allergies 05/15/2013  . Multiple nevi 05/15/2013  . Anxiety 03/01/2012  . Cough 01/11/2012  . Hypertension 01/11/2012   Prior to Admission medications   Medication Sig Start Date End Date Taking? Authorizing Provider  albuterol (PROVENTIL HFA;VENTOLIN HFA) 108 (90 BASE) MCG/ACT inhaler Inhale 2 puffs into the lungs every 4 (four) hours as needed for wheezing or shortness of breath (cough, shortness of breath or wheezing.). 11/05/15  Yes Daub, Loura Back, MD  ALPRAZolam Duanne Moron) 0.5 MG tablet TAKE 1 TABLET EVERY DAY AS NEEDED FOR ANXIETY 04/09/17  Yes Jeffery, Chelle, PA-C  busPIRone (BUSPAR) 15 MG tablet Increase total daily dose by 5 mg every 5-7 days up to 15 mg BID. 06/27/16  Yes Jeffery, Chelle, PA-C  cetirizine (ZYRTEC) 10 MG tablet Take 10 mg by mouth as needed.   Yes [provider]  fluticasone (FLONASE) 50 MCG/ACT nasal spray Place 2 sprays into both nostrils as needed. 01/27/15  Yes Barton Fanny, MD  losartan (COZAAR) 50 MG tablet Take 1 tablet (50 mg total) by mouth daily. 06/27/16  Yes Jeffery, Chelle, PA-C  Melatonin-Pyridoxine (MELATIN PO) Take by mouth daily.   Yes [provider]  naproxen (NAPROSYN) 500 MG tablet Take 1 tablet (500 mg total) by mouth as needed. 06/04/12  Yes Barton Fanny, MD  Norethin Ace-Eth Estrad-FE (BLISOVI 24 FE PO) Take by mouth.   Yes [provider]  vitamin C (ASCORBIC ACID) 500 MG tablet Take 500 mg by mouth as needed.   Yes [provider]  Calcium Carbonate (CALCI-CHEW PO) Take by mouth.    [provider]  citalopram (CELEXA) 20 MG tablet Take 1 tablet (20 mg total) by mouth daily. 06/27/16   Harrison Mons, PA-C  estradiol-levonorgestrel [facility]) 0.045-0.015 MG/DAY Place 1 patch onto the skin once a week.    [provider]  estradiol-norethindrone [facility]) 0.05-0.14 MG/DAY Place 1 patch onto the skin 2 (two) times a week. Reported on 12/28/2015    [provider]   Allergies  Allergen Reactions  . Ace Inhibitors   . Penicillins     childhood    Review of Systems  Constitutional: Positive for fatigue.  Eyes: Negative for visual disturbance.  Respiratory: Negative.  Negative for cough, chest tightness and shortness of breath.   Cardiovascular: Negative for chest pain and palpitations.  Gastrointestinal: Negative.  Negative for blood in stool, constipation, diarrhea, nausea and vomiting.  Endocrine: Positive for cold intolerance.  Genitourinary: Negative for dysuria, frequency, hematuria and urgency.  Musculoskeletal: Negative.   Skin: Negative.   Allergic/Immunologic: Positive for environmental allergies.  Neurological: Negative for dizziness, light-headedness and headaches.  Hematological: Negative.   Psychiatric/Behavioral: Negative.  Negative for sleep disturbance.       Objective:   Physical Exam  Constitutional: She is oriented to person,  place, and time. She appears well-developed and well-nourished. No distress.  BP 120/70   Pulse (!) 106   Temp 98.1 F (36.7 C) (Oral)   Ht 5' 8.5" (1.74 m)   Wt 167 lb (75.8 kg)   LMP 11/01/2014   SpO2 99%   BMI 25.02 kg/m    HENT:  Head: Normocephalic and atraumatic.  Eyes: Conjunctivae and EOM are normal. Pupils are  equal, round, and reactive to light.  Neck: Normal range of motion. Neck supple. No thyromegaly present.  Cardiovascular: Normal rate, regular rhythm, normal heart sounds and intact distal pulses.   Pulmonary/Chest: Effort normal and breath sounds normal.  Lymphadenopathy:    She has no cervical adenopathy.  Neurological: She is oriented to person, place, and time.  Skin: Skin is warm and dry. She is not diaphoretic.  Psychiatric: She has a normal mood and affect. Her behavior is normal. Judgment and thought content normal.        Assessment & Plan:  1. Essential hypertension Stable. Well controlled with current medication.  - CBC with Differential/Platelet - Comprehensive metabolic panel - Lipid panel - T4, free - TSH - POCT urinalysis dipstick - losartan (COZAAR) 50 MG tablet; Take 1 tablet (50 mg total) by mouth daily.  Dispense: 90 tablet; Refill: 3  2. Generalized anxiety disorder Well controlled with current medication regimen. Continue taking medications as prescribed.  - ALPRAZolam (XANAX) 0.5 MG tablet; TAKE 1 TABLET EVERY DAY AS NEEDED FOR ANXIETY  Dispense: 30 tablet; Refill: 0 - busPIRone (BUSPAR) 15 MG tablet; Increase total daily dose by 5 mg every 5-7 days up to 15 mg BID.  Dispense: 180 tablet; Refill: 3 - citalopram (CELEXA) 20 MG tablet; Take 1 tablet (20 mg total) by mouth daily.  Dispense: 90 tablet; Refill: 3  Return in about 6 months (around 11/14/2017).

## 2017-05-15 NOTE — Progress Notes (Signed)
Patient ID: Tonya Holland, female    DOB: 1975-12-12, 41 y.o.   MRN: [id number]  PCP: Harrison Mons, PA-C  Chief Complaint  Patient presents with  . Medication Refill    xanax, buspar, celexa, losartan potassium    Subjective:   Presents for medication refill.  She is doing well, tolerating her medications without adverse effects. No CP, SOB, HA, dizziness.  Depression screen [facility] 2/9 05/15/2017 06/27/2016 12/28/2015 11/05/2015 05/31/2015  Decreased Interest 0 0 0 0 0  Down, Depressed, Hopeless 0 0 0 0 0  PHQ - 2 Score 0 0 0 0 0    Review of Systems  Constitutional: Negative.   HENT: Negative for sore throat.   Eyes: Negative for visual disturbance.  Respiratory: Negative for cough, chest tightness, shortness of breath and wheezing.   Cardiovascular: Negative for chest pain and palpitations.  Gastrointestinal: Negative for abdominal pain, diarrhea, nausea and vomiting.  Genitourinary: Negative for dysuria, frequency, hematuria and urgency.  Musculoskeletal: Negative for arthralgias and myalgias.  Skin: Negative for rash.  Neurological: Negative for dizziness, weakness and headaches.  Psychiatric/Behavioral: Negative for decreased concentration. The patient is not nervous/anxious.     Patient Active Problem List   Diagnosis Date Noted  . Family history of malignant neoplasm of breast 07/15/2014  . Seasonal allergies 05/15/2013  . Multiple nevi 05/15/2013  . Anxiety 03/01/2012  . Cough 01/11/2012  . Hypertension 01/11/2012     Prior to Admission medications   Medication Sig Start Date End Date Taking? Authorizing Provider  albuterol (PROVENTIL HFA;VENTOLIN HFA) 108 (90 BASE) MCG/ACT inhaler Inhale 2 puffs into the lungs every 4 (four) hours as needed for wheezing or shortness of breath (cough, shortness of breath or wheezing.). 11/05/15  Yes Daub, Loura Back, MD  ALPRAZolam Duanne Moron) 0.5 MG tablet TAKE 1 TABLET EVERY DAY AS NEEDED FOR ANXIETY 04/09/17  Yes  Emmitte Surgeon, PA-C  busPIRone (BUSPAR) 15 MG tablet Increase total daily dose by 5 mg every 5-7 days up to 15 mg BID. 06/27/16  Yes Nakeisha Greenhouse, PA-C  cetirizine (ZYRTEC) 10 MG tablet Take 10 mg by mouth as needed.   Yes [provider]  fluticasone (FLONASE) 50 MCG/ACT nasal spray Place 2 sprays into both nostrils as needed. 01/27/15  Yes Barton Fanny, MD  losartan (COZAAR) 50 MG tablet Take 1 tablet (50 mg total) by mouth daily. 06/27/16  Yes Kathryne Ramella, PA-C  Melatonin-Pyridoxine (MELATIN PO) Take by mouth daily.   Yes [provider]  naproxen (NAPROSYN) 500 MG tablet Take 1 tablet (500 mg total) by mouth as needed. 06/04/12  Yes Barton Fanny, MD  Norethin Ace-Eth Estrad-FE (BLISOVI 24 FE PO) Take by mouth.   Yes [provider]  vitamin C (ASCORBIC ACID) 500 MG tablet Take 500 mg by mouth as needed.   Yes [provider]  Calcium Carbonate (CALCI-CHEW PO) Take by mouth.    [provider]  citalopram (CELEXA) 20 MG tablet Take 1 tablet (20 mg total) by mouth daily. 06/27/16   Harrison Mons, PA-C  estradiol-levonorgestrel [facility]) 0.045-0.015 MG/DAY Place 1 patch onto the skin once a week.    [provider]  estradiol-norethindrone [facility]) 0.05-0.14 MG/DAY Place 1 patch onto the skin 2 (two) times a week. Reported on 12/28/2015    [provider]     Allergies  Allergen Reactions  . Ace Inhibitors   . Penicillins     childhood       Objective:  Physical Exam  Constitutional: She is oriented to person, place, and time. She appears well-developed and well-nourished. She is active and cooperative. No distress.  BP 120/70   Pulse (!) 106   Temp 98.1 F (36.7 C) (Oral)   Ht 5' 8.5" (1.74 m)   Wt 167 lb (75.8 kg)   LMP 11/01/2014   SpO2 99%   BMI 25.02 kg/m   HENT:  Head: Normocephalic and atraumatic.  Right Ear: Hearing normal.  Left Ear: Hearing normal.  Eyes: Conjunctivae are  normal. No scleral icterus.  Neck: Normal range of motion. Neck supple. No thyromegaly present.  Cardiovascular: Normal rate, regular rhythm and normal heart sounds.   Pulses:      Radial pulses are 2+ on the right side, and 2+ on the left side.  Pulmonary/Chest: Effort normal and breath sounds normal.  Lymphadenopathy:       Head (right side): No tonsillar, no preauricular, no posterior auricular and no occipital adenopathy present.       Head (left side): No tonsillar, no preauricular, no posterior auricular and no occipital adenopathy present.    She has no cervical adenopathy.       Right: No supraclavicular adenopathy present.       Left: No supraclavicular adenopathy present.  Neurological: She is alert and oriented to person, place, and time. No sensory deficit.  Skin: Skin is warm, dry and intact. No rash noted. No cyanosis or erythema. Nails show no clubbing.  Psychiatric: She has a normal mood and affect. Her speech is normal and behavior is normal.       Assessment & Plan:   Problem List Items Addressed This Visit    Hypertension - Primary    Well controlled on losartan. Continue.      Relevant Medications   losartan (COZAAR) 50 MG tablet   Other Relevant Orders   CBC with Differential/Platelet   Comprehensive metabolic panel   Lipid panel   T4, free   TSH   POCT urinalysis dipstick (Completed)   Generalized anxiety disorder    Well controlled on current treatment. No change.      Relevant Medications   ALPRAZolam (XANAX) 0.5 MG tablet   busPIRone (BUSPAR) 15 MG tablet   citalopram (CELEXA) 20 MG tablet       Return in about 6 months (around 11/14/2017).   Fara Chute, PA-C [facility] at [place]

## 2017-05-15 NOTE — Assessment & Plan Note (Signed)
Well-controlled on losartan.  Continue. 

## 2017-05-15 NOTE — Patient Instructions (Addendum)
Enjoy the beach!    IF you received an x-ray today, you will receive an invoice from [facility]. Please contact [facility] at [phone] with questions or concerns regarding your invoice.   IF you received labwork today, you will receive an invoice from [place]. Please contact [facility] at [phone] with questions or concerns regarding your invoice.   Our billing staff will not be able to assist you with questions regarding bills from these companies.  You will be contacted with the lab results as soon as they are available. The fastest way to get your results is to activate your My Chart account. Instructions are located on the last page of this paperwork. If you have not heard from [place] regarding the results in 2 weeks, please contact this office.

## 2017-05-15 NOTE — Assessment & Plan Note (Signed)
Well controlled on current treatment. No change.

## 2017-05-16 LAB — CBC WITH DIFFERENTIAL/PLATELET
Basophils Absolute: 0 10*3/uL (ref 0.0–0.2)
Basos: 0 %
EOS (ABSOLUTE): 0.1 10*3/uL (ref 0.0–0.4)
EOS: 2 %
HEMATOCRIT: 40.8 % (ref 34.0–46.6)
HEMOGLOBIN: 13.5 g/dL (ref 11.1–15.9)
IMMATURE GRANS (ABS): 0 10*3/uL (ref 0.0–0.1)
Immature Granulocytes: 0 %
LYMPHS ABS: 2.1 10*3/uL (ref 0.7–3.1)
LYMPHS: 28 %
MCH: 30 pg (ref 26.6–33.0)
MCHC: 33.1 g/dL (ref 31.5–35.7)
MCV: 91 fL (ref 79–97)
MONOCYTES: 6 %
Monocytes Absolute: 0.4 10*3/uL (ref 0.1–0.9)
NEUTROS ABS: 4.7 10*3/uL (ref 1.4–7.0)
Neutrophils: 64 %
Platelets: 290 10*3/uL (ref 150–379)
RBC: 4.5 x10E6/uL (ref 3.77–5.28)
RDW: 13.6 % (ref 12.3–15.4)
WBC: 7.4 10*3/uL (ref 3.4–10.8)

## 2017-05-16 LAB — LIPID PANEL
CHOL/HDL RATIO: 2.7 ratio (ref 0.0–4.4)
CHOLESTEROL TOTAL: 124 mg/dL (ref 100–199)
HDL: 46 mg/dL (ref >39)
LDL CALC: 60 mg/dL (ref 0–99)
Triglycerides: 91 mg/dL (ref 0–149)
VLDL Cholesterol Cal: 18 mg/dL (ref 5–40)

## 2017-05-16 LAB — COMPREHENSIVE METABOLIC PANEL
ALBUMIN: 4 g/dL (ref 3.5–5.5)
ALK PHOS: 50 IU/L (ref 39–117)
ALT: 16 IU/L (ref 0–32)
AST: 14 IU/L (ref 0–40)
Albumin/Globulin Ratio: 1.9 (ref 1.2–2.2)
BUN / CREAT RATIO: 21 (ref 9–23)
BUN: 17 mg/dL (ref 6–24)
Bilirubin Total: 0.4 mg/dL (ref 0.0–1.2)
CO2: 24 mmol/L (ref 20–29)
Calcium: 8.9 mg/dL (ref 8.7–10.2)
Chloride: 101 mmol/L (ref 96–106)
Creatinine, Ser: 0.82 mg/dL (ref 0.57–1.00)
GFR calc non Af Amer: 89 mL/min/{1.73_m2} (ref >59)
GFR, EST AFRICAN AMERICAN: 102 mL/min/{1.73_m2} (ref >59)
GLOBULIN, TOTAL: 2.1 g/dL (ref 1.5–4.5)
Glucose: 80 mg/dL (ref 65–99)
Potassium: 4.2 mmol/L (ref 3.5–5.2)
SODIUM: 139 mmol/L (ref 134–144)
TOTAL PROTEIN: 6.1 g/dL (ref 6.0–8.5)

## 2017-05-16 LAB — T4, FREE: FREE T4: 1.37 ng/dL (ref 0.82–1.77)

## 2017-05-16 LAB — TSH: TSH: 2.44 u[IU]/mL (ref 0.450–4.500)

## 2017-07-25 ENCOUNTER — Other Ambulatory Visit: Payer: Self-pay | Admitting: Physician Assistant

## 2017-07-25 ENCOUNTER — Telehealth: Payer: Self-pay

## 2017-07-25 DIAGNOSIS — F411 Generalized anxiety disorder: Secondary | ICD-10-CM

## 2017-07-25 NOTE — Telephone Encounter (Signed)
  Meds ordered this encounter  Medications  . ALPRAZolam (XANAX) 0.5 MG tablet    Sig: TAKE 1 TABLET EVERY DAY AS NEEDED FOR ANXIETY    Dispense:  30 tablet    Refill:  0    Not to exceed 5 additional fills before 11/11/2017

## 2017-07-25 NOTE — Telephone Encounter (Signed)
Left pt VM and informed her that Rx is ready for pickup at the [address].

## 2017-07-31 ENCOUNTER — Ambulatory Visit (INDEPENDENT_AMBULATORY_CARE_PROVIDER_SITE_OTHER): Payer: BLUE CROSS/BLUE SHIELD | Admitting: Family Medicine

## 2017-07-31 ENCOUNTER — Encounter: Payer: Self-pay | Admitting: Family Medicine

## 2017-07-31 VITALS — BP 113/77 | HR 96 | Temp 98.7°F | Resp 16 | Ht 69.0 in | Wt 162.0 lb

## 2017-07-31 DIAGNOSIS — R059 Cough, unspecified: Secondary | ICD-10-CM

## 2017-07-31 DIAGNOSIS — J0100 Acute maxillary sinusitis, unspecified: Secondary | ICD-10-CM | POA: Diagnosis not present

## 2017-07-31 DIAGNOSIS — R05 Cough: Secondary | ICD-10-CM | POA: Diagnosis not present

## 2017-07-31 MED ORDER — DOXYCYCLINE HYCLATE 100 MG PO TABS: 100.0000 mg | ORAL_TABLET | Freq: Two times a day (BID) | ORAL | 0 refills | Status: DC

## 2017-07-31 MED ORDER — HYDROCODONE-HOMATROPINE 5-1.5 MG/5ML PO SYRP: ORAL_SOLUTION | ORAL | 0 refills | Status: DC

## 2017-07-31 NOTE — Progress Notes (Signed)
Subjective:  By signing my name below, I, Essence Howell, attest that this documentation has been prepared under the direction and in the presence of Wendie Agreste, MD Electronically Signed: Ladene Artist, ED Scribe 07/31/2017 at 3:14 PM.   Patient ID: Tonya Holland, female    DOB: [birth year], 41 y.o.   MRN: [id number]  Chief Complaint  Patient presents with  . Sinus Problem    HA, cough productive with yellow green sputum, lethargy   HPI Tonya Holland is a 41 y.o. female who presents to Primary Care at [facility] complaining of URI symptoms. Pt states that symptoms started as a sore throat 10 days ago which has since resolved. She reports associated symptoms of subjective fever several days ago that has resolved, fatigue, purulent/yellow nasal discharge, HA, productive cough with thick yellow/green sputum, postnasal drip, generalized myalgias, pressure/popping in bilateral ears. Pt has tried Mucinex, Claritin, Benadryl, NyQuil, Flonase and Neti pot without significant relief. No sick contacts. Childhood allergy to penicillins; unsure of reaction. Denies addiction to codeine or side-effects of nausea, vomiting with taking it.  Patient Active Problem List   Diagnosis Date Noted  . Family history of malignant neoplasm of breast 07/15/2014  . Seasonal allergies 05/15/2013  . Multiple nevi 05/15/2013  . Generalized anxiety disorder 03/01/2012  . Hypertension 01/11/2012   Past Medical History:  Diagnosis Date  . Allergy   . Anxiety   . Asthma   . Chronic headache   . Cough 01/11/2012   Followed in Pulmonary clinic/ [facility]/ [place]     - Try off ACEI 01/11/2012    . Hypertension    Past Surgical History:  Procedure Laterality Date  . [place]   right  . breast tumor removed  at age 52   right  . EYE SURGERY     both 1998 and right 1998  . LASIK  at age 64    Allergies  Allergen Reactions  . Ace Inhibitors   . Penicillins     childhood    Prior to Admission medications   Medication Sig Start Date End Date Taking? Authorizing Provider  albuterol (PROVENTIL HFA;VENTOLIN HFA) 108 (90 BASE) MCG/ACT inhaler Inhale 2 puffs into the lungs every 4 (four) hours as needed for wheezing or shortness of breath (cough, shortness of breath or wheezing.). 11/05/15  Yes Daub, Loura Back, MD  ALPRAZolam Duanne Moron) 0.5 MG tablet TAKE 1 TABLET EVERY DAY AS NEEDED FOR ANXIETY 07/25/17  Yes Jeffery, Chelle, PA-C  busPIRone (BUSPAR) 15 MG tablet Increase total daily dose by 5 mg every 5-7 days up to 15 mg BID. 05/15/17  Yes Jeffery, Chelle, PA-C  Calcium Carbonate (CALCI-CHEW PO) Take by mouth.   Yes [provider]  cetirizine (ZYRTEC) 10 MG tablet Take 10 mg by mouth as needed.   Yes [provider]  citalopram (CELEXA) 20 MG tablet Take 1 tablet (20 mg total) by mouth daily. 05/15/17  Yes Jeffery, Chelle, PA-C  fluticasone (FLONASE) 50 MCG/ACT nasal spray Place 2 sprays into both nostrils as needed. 01/27/15  Yes Barton Fanny, MD  losartan (COZAAR) 50 MG tablet Take 1 tablet (50 mg total) by mouth daily. 05/15/17  Yes Jeffery, Chelle, PA-C  Melatonin-Pyridoxine (MELATIN PO) Take by mouth daily.   Yes [provider]  naproxen (NAPROSYN) 500 MG tablet Take 1 tablet (500 mg total) by mouth as needed. 06/04/12  Yes Barton Fanny, MD  Norethin Ace-Eth Estrad-FE (BLISOVI 24 FE PO) Take  by mouth.   Yes [provider]  vitamin C (ASCORBIC ACID) 500 MG tablet Take 500 mg by mouth as needed.   Yes [provider]   Social History   Social History  . Marital status: Single    Spouse name: partner  . Number of children: Y  . Years of education: N/A   Occupational History  . 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 Smoking status: Never Smoker  . Smokeless tobacco: Never Used  . Alcohol use Yes     Comment: occasionally wine  . Drug use: No  . Sexual activity:  Yes    Birth control/ protection: None     Comment: number of sex partners in the last 69 months 1   Other Topics Concern  . Not on file   Social History Narrative   Exercise running 1 week ([employer] form) for 30 minutes. Patient has irregular menses.    Review of Systems  Constitutional: Positive for fatigue and fever (subjective; resolved).  HENT: Positive for ear pain, postnasal drip, rhinorrhea and sore throat (resolved).   Respiratory: Positive for cough.   Musculoskeletal: Positive for myalgias.  Neurological: Positive for headaches.      Objective:   Physical Exam  Constitutional: She is oriented to person, place, and time. She appears well-developed and well-nourished. No distress.  HENT:  Head: Normocephalic and atraumatic.  Right Ear: Hearing, tympanic membrane, external ear and ear canal normal.  Left Ear: Hearing, tympanic membrane, external ear and ear canal normal.  Nose: Right sinus exhibits maxillary sinus tenderness. Left sinus exhibits maxillary sinus tenderness.  Mouth/Throat: Oropharynx is clear and moist. No oropharyngeal exudate.  R TM: Minimal clear/white fluid at base of TM. TM is pearly grey. Canal is clear.  Eyes: Pupils are equal, round, and reactive to light. Conjunctivae and EOM are normal.  Cardiovascular: Normal rate, regular rhythm, normal heart sounds and intact distal pulses.   No murmur heard. Pulmonary/Chest: Effort normal and breath sounds normal. No respiratory distress. She has no wheezes. She has no rhonchi.  Lymphadenopathy:    She has no cervical adenopathy.  Neurological: She is alert and oriented to person, place, and time.  Skin: Skin is warm and dry. No rash noted.  Psychiatric: She has a normal mood and affect. Her behavior is normal.  Vitals reviewed.  Vitals:   07/31/17 1428  BP: 113/77  Pulse: 96  Resp: 16  Temp: 98.7 F (37.1 C)  TempSrc: Oral  SpO2: 98%  Weight: 162 lb (73.5 kg)  Height: 5\' 9"  (1.753 m)       Assessment & Plan:    Tonya Holland is a 41 y.o. female Acute maxillary sinusitis, recurrence not specified - Plan: doxycycline (VIBRA-TABS) 100 MG tablet  Cough - Plan: HYDROcodone-homatropine (HYCODAN) 5-1.5 MG/5ML syrup  Ten-day history of purulent nasal discharge, suspected secondary bacterial sinusitis with cough from drainage at night.  -Initially planned quinolone antibiotic, but with citalopram, risk of QT prolongation.  -Started on doxycycline, 100 mg twice a day. Potential side effects, RTC precautions if not improving within 5-7 days. Saline nasal spray, other symptom care discussed  - Hycodan cough syrup for cough at night if needed. Side effects discussed.  Meds ordered this encounter  Medications  . HYDROcodone-homatropine (HYCODAN) 5-1.5 MG/5ML syrup    Sig: 3m by mouth a bedtime as needed for cough.    Dispense:  120 mL    Refill:  0  .  doxycycline (VIBRA-TABS) 100 MG tablet    Sig: Take 1 tablet (100 mg total) by mouth 2 (two) times daily.    Dispense:  20 tablet    Refill:  0   Patient Instructions   Saline nasal spray 4-5 times per day for congestion, Sudafed over-the-counter if needed for pressure behind the ear or sinus pressure, but make sure you're drinking plenty of fluids, and rest as needed.   Start antibiotic doxycycline 1 pill twice per day. You should be improving within the next 5-7 days.   Hydrocodone cough syrup if needed at night, see other information below for sinus infections.    Sinusitis, Adult Sinusitis is soreness and inflammation of your sinuses. Sinuses are hollow spaces in the bones around your face. Your sinuses are located:  Around your eyes.  In the middle of your forehead.  Behind your nose.  In your cheekbones.  Your sinuses and nasal passages are lined with a stringy fluid (mucus). Mucus normally drains out of your sinuses. When your nasal tissues become inflamed or swollen, the mucus can become trapped or  blocked so air cannot flow through your sinuses. This allows bacteria, viruses, and funguses to grow, which leads to infection. Sinusitis can develop quickly and last for 7?10 days (acute) or for more than 12 weeks (chronic). Sinusitis often develops after a cold. What are the causes? This condition is caused by anything that creates swelling in the sinuses or stops mucus from draining, including:  Allergies.  Asthma.  Bacterial or viral infection.  Abnormally shaped bones between the nasal passages.  Nasal growths that contain mucus (nasal polyps).  Narrow sinus openings.  Pollutants, such as chemicals or irritants in the air.  A foreign object stuck in the nose.  A fungal infection. This is rare.  What increases the risk? The following factors may make you more likely to develop this condition:  Having allergies or asthma.  Having had a recent cold or respiratory tract infection.  Having structural deformities or blockages in your nose or sinuses.  Having a weak immune system.  Doing a lot of swimming or diving.  Overusing nasal sprays.  Smoking.  What are the signs or symptoms? The main symptoms of this condition are pain and a feeling of pressure around the affected sinuses. Other symptoms include:  Upper toothache.  Earache.  Headache.  Bad breath.  Decreased sense of smell and taste.  A cough that may get worse at night.  Fatigue.  Fever.  Thick drainage from your nose. The drainage is often green and it may contain pus (purulent).  Stuffy nose or congestion.  Postnasal drip. This is when extra mucus collects in the throat or back of the nose.  Swelling and warmth over the affected sinuses.  Sore throat.  Sensitivity to light.  How is this diagnosed? This condition is diagnosed based on symptoms, a medical history, and a physical exam. To find out if your condition is acute or chronic, your health care provider may:  Look in your nose  for signs of nasal polyps.  Tap over the affected sinus to check for signs of infection.  View the inside of your sinuses using an imaging device that has a light attached (endoscope).  If your health care provider suspects that you have chronic sinusitis, you may also:  Be tested for allergies.  Have a sample of mucus taken from your nose (nasal culture) and checked for bacteria.  Have a mucus sample examined to see  if your sinusitis is related to an allergy.  If your sinusitis does not respond to treatment and it lasts longer than 8 weeks, you may have an MRI or CT scan to check your sinuses. These scans also help to determine how severe your infection is. In rare cases, a bone biopsy may be done to rule out more serious types of fungal sinus disease. How is this treated? Treatment for sinusitis depends on the cause and whether your condition is chronic or acute. If a virus is causing your sinusitis, your symptoms will go away on their own within 10 days. You may be given medicines to relieve your symptoms, including:  Topical nasal decongestants. They shrink swollen nasal passages and let mucus drain from your sinuses.  Antihistamines. These drugs block inflammation that is triggered by allergies. This can help to ease swelling in your nose and sinuses.  Topical nasal corticosteroids. These are nasal sprays that ease inflammation and swelling in your nose and sinuses.  Nasal saline washes. These rinses can help to get rid of thick mucus in your nose.  If your condition is caused by bacteria, you will be given an antibiotic medicine. If your condition is caused by a fungus, you will be given an antifungal medicine. Surgery may be needed to correct underlying conditions, such as narrow nasal passages. Surgery may also be needed to remove polyps. Follow these instructions at home: Medicines  Take, use, or apply over-the-counter and prescription medicines only as told by your health  care provider. These may include nasal sprays.  If you were prescribed an antibiotic medicine, take it as told by your health care provider. Do not stop taking the antibiotic even if you start to feel better. Hydrate and Humidify  Drink enough water to keep your urine clear or pale yellow. Staying hydrated will help to thin your mucus.  Use a cool mist humidifier to keep the humidity level in your home above 50%.  Inhale steam for 10-15 minutes, 3-4 times a day or as told by your health care provider. You can do this in the bathroom while a hot shower is running.  Limit your exposure to cool or dry air. Rest  Rest as much as possible.  Sleep with your head raised (elevated).  Make sure to get enough sleep each night. General instructions  Apply a warm, moist washcloth to your face 3-4 times a day or as told by your health care provider. This will help with discomfort.  Wash your hands often with soap and water to reduce your exposure to viruses and other germs. If soap and water are not available, use hand sanitizer.  Do not smoke. Avoid being around people who are smoking (secondhand smoke).  Keep all follow-up visits as told by your health care provider. This is important. Contact a health care provider if:  You have a fever.  Your symptoms get worse.  Your symptoms do not improve within 10 days. Get help right away if:  You have a severe headache.  You have persistent vomiting.  You have pain or swelling around your face or eyes.  You have vision problems.  You develop confusion.  Your neck is stiff.  You have trouble breathing. This information is not intended to replace advice given to you by your health care provider. Make sure you discuss any questions you have with your health care provider. Document Released: 11/20/2005 Document Revised: 07/16/2016 Document Reviewed: 09/15/2015 Elsevier Interactive Patient Education  2017 [employer].  IF you  received an x-ray today, you will receive an invoice from [facility]. Please contact [facility] at [phone] with questions or concerns regarding your invoice.   IF you received labwork today, you will receive an invoice from [place]. Please contact [facility] at [phone] with questions or concerns regarding your invoice.   Our billing staff will not be able to assist you with questions regarding bills from these companies.  You will be contacted with the lab results as soon as they are available. The fastest way to get your results is to activate your My Chart account. Instructions are located on the last page of this paperwork. If you have not heard from [place] regarding the results in 2 weeks, please contact this office.       I personally performed the services described in this documentation, which was scribed in my presence. The recorded information has been reviewed and considered for accuracy and completeness, addended by me as needed, and agree with information above.  Signed,   Merri Ray, MD [facility] at [place].  07/31/17 4:43 PM

## 2017-07-31 NOTE — Patient Instructions (Addendum)
Saline nasal spray 4-5 times per day for congestion, Sudafed over-the-counter if needed for pressure behind the ear or sinus pressure, but make sure you're drinking plenty of fluids, and rest as needed.   Start antibiotic doxycycline 1 pill twice per day. You should be improving within the next 5-7 days.   Hydrocodone cough syrup if needed at night, see other information below for sinus infections.    Sinusitis, Adult Sinusitis is soreness and inflammation of your sinuses. Sinuses are hollow spaces in the bones around your face. Your sinuses are located:  Around your eyes.  In the middle of your forehead.  Behind your nose.  In your cheekbones.  Your sinuses and nasal passages are lined with a stringy fluid (mucus). Mucus normally drains out of your sinuses. When your nasal tissues become inflamed or swollen, the mucus can become trapped or blocked so air cannot flow through your sinuses. This allows bacteria, viruses, and funguses to grow, which leads to infection. Sinusitis can develop quickly and last for 7?10 days (acute) or for more than 12 weeks (chronic). Sinusitis often develops after a cold. What are the causes? This condition is caused by anything that creates swelling in the sinuses or stops mucus from draining, including:  Allergies.  Asthma.  Bacterial or viral infection.  Abnormally shaped bones between the nasal passages.  Nasal growths that contain mucus (nasal polyps).  Narrow sinus openings.  Pollutants, such as chemicals or irritants in the air.  A foreign object stuck in the nose.  A fungal infection. This is rare.  What increases the risk? The following factors may make you more likely to develop this condition:  Having allergies or asthma.  Having had a recent cold or respiratory tract infection.  Having structural deformities or blockages in your nose or sinuses.  Having a weak immune system.  Doing a lot of swimming or diving.  Overusing  nasal sprays.  Smoking.  What are the signs or symptoms? The main symptoms of this condition are pain and a feeling of pressure around the affected sinuses. Other symptoms include:  Upper toothache.  Earache.  Headache.  Bad breath.  Decreased sense of smell and taste.  A cough that may get worse at night.  Fatigue.  Fever.  Thick drainage from your nose. The drainage is often green and it may contain pus (purulent).  Stuffy nose or congestion.  Postnasal drip. This is when extra mucus collects in the throat or back of the nose.  Swelling and warmth over the affected sinuses.  Sore throat.  Sensitivity to light.  How is this diagnosed? This condition is diagnosed based on symptoms, a medical history, and a physical exam. To find out if your condition is acute or chronic, your health care provider may:  Look in your nose for signs of nasal polyps.  Tap over the affected sinus to check for signs of infection.  View the inside of your sinuses using an imaging device that has a light attached (endoscope).  If your health care provider suspects that you have chronic sinusitis, you may also:  Be tested for allergies.  Have a sample of mucus taken from your nose (nasal culture) and checked for bacteria.  Have a mucus sample examined to see if your sinusitis is related to an allergy.  If your sinusitis does not respond to treatment and it lasts longer than 8 weeks, you may have an MRI or CT scan to check your sinuses. These scans also help to  determine how severe your infection is. In rare cases, a bone biopsy may be done to rule out more serious types of fungal sinus disease. How is this treated? Treatment for sinusitis depends on the cause and whether your condition is chronic or acute. If a virus is causing your sinusitis, your symptoms will go away on their own within 10 days. You may be given medicines to relieve your symptoms, including:  Topical nasal  decongestants. They shrink swollen nasal passages and let mucus drain from your sinuses.  Antihistamines. These drugs block inflammation that is triggered by allergies. This can help to ease swelling in your nose and sinuses.  Topical nasal corticosteroids. These are nasal sprays that ease inflammation and swelling in your nose and sinuses.  Nasal saline washes. These rinses can help to get rid of thick mucus in your nose.  If your condition is caused by bacteria, you will be given an antibiotic medicine. If your condition is caused by a fungus, you will be given an antifungal medicine. Surgery may be needed to correct underlying conditions, such as narrow nasal passages. Surgery may also be needed to remove polyps. Follow these instructions at home: Medicines  Take, use, or apply over-the-counter and prescription medicines only as told by your health care provider. These may include nasal sprays.  If you were prescribed an antibiotic medicine, take it as told by your health care provider. Do not stop taking the antibiotic even if you start to feel better. Hydrate and Humidify  Drink enough water to keep your urine clear or pale yellow. Staying hydrated will help to thin your mucus.  Use a cool mist humidifier to keep the humidity level in your home above 50%.  Inhale steam for 10-15 minutes, 3-4 times a day or as told by your health care provider. You can do this in the bathroom while a hot shower is running.  Limit your exposure to cool or dry air. Rest  Rest as much as possible.  Sleep with your head raised (elevated).  Make sure to get enough sleep each night. General instructions  Apply a warm, moist washcloth to your face 3-4 times a day or as told by your health care provider. This will help with discomfort.  Wash your hands often with soap and water to reduce your exposure to viruses and other germs. If soap and water are not available, use hand sanitizer.  Do not smoke.  Avoid being around people who are smoking (secondhand smoke).  Keep all follow-up visits as told by your health care provider. This is important. Contact a health care provider if:  You have a fever.  Your symptoms get worse.  Your symptoms do not improve within 10 days. Get help right away if:  You have a severe headache.  You have persistent vomiting.  You have pain or swelling around your face or eyes.  You have vision problems.  You develop confusion.  Your neck is stiff.  You have trouble breathing. This information is not intended to replace advice given to you by your health care provider. Make sure you discuss any questions you have with your health care provider. Document Released: 11/20/2005 Document Revised: 07/16/2016 Document Reviewed: 09/15/2015 Elsevier Interactive Patient Education  2017 [employer].    IF you received an x-ray today, you will receive an invoice from [facility]. Please contact [facility] at [phone] with questions or concerns regarding your invoice.   IF you received labwork today, you will receive an  invoice from [facility]. Please contact [facility] at [phone] with questions or concerns regarding your invoice.   Our billing staff will not be able to assist you with questions regarding bills from these companies.  You will be contacted with the lab results as soon as they are available. The fastest way to get your results is to activate your My Chart account. Instructions are located on the last page of this paperwork. If you have not heard from [place] regarding the results in 2 weeks, please contact this office.

## 2017-09-20 ENCOUNTER — Other Ambulatory Visit: Payer: Self-pay | Admitting: Physician Assistant

## 2017-09-20 DIAGNOSIS — F411 Generalized anxiety disorder: Secondary | ICD-10-CM

## 2017-09-21 ENCOUNTER — Telehealth: Payer: Self-pay

## 2017-09-21 NOTE — Telephone Encounter (Signed)
.   Meds ordered this encounter  Medications  . ALPRAZolam (XANAX) 0.5 MG tablet    Sig: TAKE 1 TABLET BY MOUTH EVERY DAY AS NEEDED FOR ANXIETY    Dispense:  30 tablet    Refill:  0    Not to exceed 5 additional fills before 01/21/2018

## 2017-11-13 ENCOUNTER — Ambulatory Visit: Payer: BLUE CROSS/BLUE SHIELD | Admitting: Physician Assistant

## 2017-11-21 ENCOUNTER — Ambulatory Visit: Payer: BLUE CROSS/BLUE SHIELD | Admitting: Physician Assistant

## 2017-11-23 NOTE — Telephone Encounter (Signed)
Error-Sign Encounter 

## 2017-12-07 ENCOUNTER — Ambulatory Visit: Payer: BLUE CROSS/BLUE SHIELD | Admitting: Physician Assistant

## 2017-12-07 ENCOUNTER — Encounter: Payer: Self-pay | Admitting: Physician Assistant

## 2017-12-07 ENCOUNTER — Other Ambulatory Visit: Payer: Self-pay

## 2017-12-07 DIAGNOSIS — F411 Generalized anxiety disorder: Secondary | ICD-10-CM | POA: Diagnosis not present

## 2017-12-07 DIAGNOSIS — R519 Headache, unspecified: Secondary | ICD-10-CM | POA: Insufficient documentation

## 2017-12-07 DIAGNOSIS — R51 Headache: Secondary | ICD-10-CM

## 2017-12-07 DIAGNOSIS — I10 Essential (primary) hypertension: Secondary | ICD-10-CM | POA: Diagnosis not present

## 2017-12-07 MED ORDER — NAPROXEN 500 MG PO TABS: 500.0000 mg | ORAL_TABLET | ORAL | 2 refills | Status: AC | PRN

## 2017-12-07 MED ORDER — CITALOPRAM HYDROBROMIDE 20 MG PO TABS: 20.0000 mg | ORAL_TABLET | Freq: Every day | ORAL | 3 refills | Status: DC

## 2017-12-07 MED ORDER — ALPRAZOLAM 0.5 MG PO TABS: ORAL_TABLET | ORAL | 0 refills | Status: DC

## 2017-12-07 MED ORDER — LOSARTAN POTASSIUM 50 MG PO TABS: 50.0000 mg | ORAL_TABLET | Freq: Every day | ORAL | 3 refills | Status: DC

## 2017-12-07 NOTE — Patient Instructions (Signed)
     IF you received an x-ray today, you will receive an invoice from [facility]. Please contact [facility] at [phone] with questions or concerns regarding your invoice.   IF you received labwork today, you will receive an invoice from [facility]. Please contact [facility] at [phone] with questions or concerns regarding your invoice.   Our billing staff will not be able to assist you with questions regarding bills from these companies.  You will be contacted with the lab results as soon as they are available. The fastest way to get your results is to activate your My Chart account. Instructions are located on the last page of this paperwork. If you have not heard from us regarding the results in 2 weeks, please contact this office.     

## 2017-12-07 NOTE — Progress Notes (Signed)
Patient ID: Tonya Holland, female    DOB: [birth year], 42 y.o.   MRN: [id number]  PCP: Harrison Mons, PA-C  Chief Complaint  Patient presents with  . Medication Refill    xanax, cozaar, celexa, and buspar has been on back order would like a new medication instead     Subjective:   Presents for evaluation of HTN, GAD.  Weaned herself off of buspirone in November, due to a manufacturer back order, and has been ok, but feels that her symptoms are not adequately controlled. Would like to know what alternatives there are.  Has had increased migraine headaches recently. Relates 4 in the past 6 months. Associated with photophobia and nausea/vomiting. Tylenol and Ibuprofen are partially helpful. Last 4-5 hours. Has not been able to identify any specific triggers. No new symptoms associated with the headaches. Recalls previously got relief with naproxen at the onset of headache, and requests that again. H.as never taken a triptan nor migraine prevention  Tolerates her medications without adverse effects.    Review of Systems As above. No CP, SOB, dizziness.  Depression screen [facility] 2/9 12/07/2017 07/31/2017 05/15/2017 06/27/2016 12/28/2015  Decreased Interest 0 0 0 0 0  Down, Depressed, Hopeless 0 0 0 0 0  PHQ - 2 Score 0 0 0 0 0       Patient Active Problem List   Diagnosis Date Noted  . Family history of malignant neoplasm of breast 07/15/2014  . Seasonal allergies 05/15/2013  . Multiple nevi 05/15/2013  . Generalized anxiety disorder 03/01/2012  . Hypertension 01/11/2012     Prior to Admission medications   Medication Sig Start Date End Date Taking? Authorizing Provider  albuterol (PROVENTIL HFA;VENTOLIN HFA) 108 (90 BASE) MCG/ACT inhaler Inhale 2 puffs into the lungs every 4 (four) hours as needed for wheezing or shortness of breath (cough, shortness of breath or wheezing.). 11/05/15  Yes Daub, Loura Back, MD  ALPRAZolam Duanne Moron) 0.5 MG tablet TAKE 1 TABLET BY MOUTH  EVERY DAY AS NEEDED FOR ANXIETY 09/21/17  Yes Rayen Dafoe, PA-C  Calcium Carbonate (CALCI-CHEW PO) Take by mouth.   Yes [provider]  cetirizine (ZYRTEC) 10 MG tablet Take 10 mg by mouth as needed.   Yes [provider]  citalopram (CELEXA) 20 MG tablet Take 1 tablet (20 mg total) by mouth daily. 05/15/17  Yes Darrian Grzelak, PA-C  fluticasone (FLONASE) 50 MCG/ACT nasal spray Place 2 sprays into both nostrils as needed. 01/27/15  Yes Barton Fanny, MD  losartan (COZAAR) 50 MG tablet Take 1 tablet (50 mg total) by mouth daily. 05/15/17  Yes Stevan Eberwein, PA-C  Melatonin-Pyridoxine (MELATIN PO) Take by mouth daily.   Yes [provider]  vitamin C (ASCORBIC ACID) 500 MG tablet Take 500 mg by mouth as needed.   Yes [provider]  BLISOVI 24 FE 1-20 MG-MCG(24) tablet Take 1 tablet by mouth daily. 11/28/17   [provider]  busPIRone (BUSPAR) 15 MG tablet Increase total daily dose by 5 mg every 5-7 days up to 15 mg BID. Patient not taking: Reported on 12/07/2017 05/15/17   Harrison Mons, PA-C  HYDROcodone-homatropine [facility]) 5-1.5 MG/5ML syrup 25m by mouth a bedtime as needed for cough. Patient not taking: Reported on 12/07/2017 07/31/17   Wendie Agreste, MD  naproxen (NAPROSYN) 500 MG tablet Take 1 tablet (500 mg total) by mouth as needed. Patient not taking: Reported on 12/07/2017 06/04/12   Barton Fanny, MD  Allergies  Allergen Reactions  . Ace Inhibitors   . Penicillins     childhood       Objective:  Physical Exam  Constitutional: She is oriented to person, place, and time. She appears well-developed and well-nourished. She is active and cooperative. No distress.  BP 122/70   Pulse (!) 109   Temp 98.9 F (37.2 C) (Oral)   Resp 18   Ht 5\' 9"  (1.753 m)   Wt 168 lb 6.4 oz (76.4 kg)   LMP 11/01/2014   SpO2 100%   BMI 24.87 kg/m   HENT:  Head: Normocephalic and atraumatic.  Right Ear: Hearing normal.  Left  Ear: Hearing normal.  Eyes: Conjunctivae are normal. No scleral icterus.  Neck: Normal range of motion. Neck supple. No thyromegaly present.  Cardiovascular: Normal rate, regular rhythm and normal heart sounds.  Pulses:      Radial pulses are 2+ on the right side, and 2+ on the left side.  Pulmonary/Chest: Effort normal and breath sounds normal.  Lymphadenopathy:       Head (right side): No tonsillar, no preauricular, no posterior auricular and no occipital adenopathy present.       Head (left side): No tonsillar, no preauricular, no posterior auricular and no occipital adenopathy present.    She has no cervical adenopathy.       Right: No supraclavicular adenopathy present.       Left: No supraclavicular adenopathy present.  Neurological: She is alert and oriented to person, place, and time. She has normal strength. No cranial nerve deficit or sensory deficit. Coordination normal.  Reflex Scores:      Bicep reflexes are 2+ on the right side and 2+ on the left side.      Patellar reflexes are 2+ on the right side and 2+ on the left side.      Achilles reflexes are 2+ on the right side and 2+ on the left side. Skin: Skin is warm, dry and intact. No rash noted. No cyanosis or erythema. Nails show no clubbing.  Psychiatric: She has a normal mood and affect. Her speech is normal and behavior is normal.           Assessment & Plan:   Problem List Items Addressed This Visit    Hypertension    Well controlled. Continue losartan 50 mg. If headaches continue, would consider change to CCB.      Relevant Medications   losartan (COZAAR) 50 MG tablet   Generalized anxiety disorder    Fair to good control on citalopram alone. Spoke with pharmacist, and buspirone is available. She will resume that. If future back order, would INCREASE citalopram dose to compensate.      Relevant Medications   ALPRAZolam (XANAX) 0.5 MG tablet   citalopram (CELEXA) 20 MG tablet   Headache    Presumed  migraine, but no formal diagnosis. Resume buspirone, though she doesn't think that stress/anxiety is a trigger. Retry naproxen, as it was previously helpful. However, if notes increased frequency of HA or rebound HA, will need to stop and try alternatives. Would consider change in BP regimen from ARB to CCB or BB, or the addition of topiramate.      Relevant Medications   citalopram (CELEXA) 20 MG tablet   naproxen (NAPROSYN) 500 MG tablet       Return in about 6 months (around 06/06/2018) for re-evalaution of blood pressure, mood and headache.   Fara Chute, PA-C Primary Care at [facility]

## 2017-12-10 NOTE — Assessment & Plan Note (Signed)
Presumed migraine, but no formal diagnosis. Resume buspirone, though she doesn't think that stress/anxiety is a trigger. Retry naproxen, as it was previously helpful. However, if notes increased frequency of HA or rebound HA, will need to stop and try alternatives. Would consider change in BP regimen from ARB to CCB or BB, or the addition of topiramate.

## 2017-12-10 NOTE — Assessment & Plan Note (Signed)
Fair to good control on citalopram alone. Spoke with pharmacist, and buspirone is available. She will resume that. If future back order, would INCREASE citalopram dose to compensate.

## 2017-12-10 NOTE — Assessment & Plan Note (Signed)
Well controlled. Continue losartan 50 mg. If headaches continue, would consider change to CCB.

## 2018-03-05 ENCOUNTER — Other Ambulatory Visit: Payer: Self-pay | Admitting: Physician Assistant

## 2018-03-05 DIAGNOSIS — F411 Generalized anxiety disorder: Secondary | ICD-10-CM

## 2018-03-05 NOTE — Telephone Encounter (Signed)
Rx refill request: Xanax 0.5 mg  LOV: 12/07/17  PCP: [place]: verified

## 2018-03-06 NOTE — Telephone Encounter (Signed)
Meds ordered this encounter  Medications  . ALPRAZolam (XANAX) 0.5 MG tablet    Sig: TAKE 1 TABLET BY MOUTH EVERY DAY AS NEEDED FOR ANXIETY    Dispense:  30 tablet    Refill:  0    Not to exceed 5 additional fills before 06/05/2018

## 2018-03-07 ENCOUNTER — Encounter: Payer: Self-pay | Admitting: Physician Assistant

## 2018-03-14 ENCOUNTER — Telehealth: Payer: Self-pay | Admitting: Physician Assistant

## 2018-03-14 NOTE — Telephone Encounter (Signed)
Called and left VM for pt advising of Chelle leaving the practice. I advised her to call the office and make an appt with another provider.

## 2018-06-02 ENCOUNTER — Other Ambulatory Visit: Payer: Self-pay | Admitting: Physician Assistant

## 2018-06-02 DIAGNOSIS — F411 Generalized anxiety disorder: Secondary | ICD-10-CM

## 2018-06-03 NOTE — Telephone Encounter (Signed)
Buspar 15 mg refill request  LOV 12/07/17 with Chelle Jeffery.  [facility] [address], [place]  PCP:  Grant Fontana

## 2018-06-14 ENCOUNTER — Ambulatory Visit: Payer: BLUE CROSS/BLUE SHIELD | Admitting: Physician Assistant

## 2018-06-20 ENCOUNTER — Encounter: Payer: Self-pay | Admitting: Family Medicine

## 2018-06-20 ENCOUNTER — Ambulatory Visit: Payer: BLUE CROSS/BLUE SHIELD | Admitting: Family Medicine

## 2018-06-20 ENCOUNTER — Other Ambulatory Visit: Payer: Self-pay

## 2018-06-20 VITALS — BP 140/80 | HR 93 | Temp 98.1°F | Ht 68.5 in | Wt 166.2 lb

## 2018-06-20 DIAGNOSIS — I10 Essential (primary) hypertension: Secondary | ICD-10-CM

## 2018-06-20 DIAGNOSIS — F411 Generalized anxiety disorder: Secondary | ICD-10-CM | POA: Diagnosis not present

## 2018-06-20 DIAGNOSIS — H109 Unspecified conjunctivitis: Secondary | ICD-10-CM | POA: Diagnosis not present

## 2018-06-20 MED ORDER — BUSPIRONE HCL 15 MG PO TABS: 15.0000 mg | ORAL_TABLET | Freq: Two times a day (BID) | ORAL | 0 refills | Status: DC

## 2018-06-20 MED ORDER — CITALOPRAM HYDROBROMIDE 20 MG PO TABS: 20.0000 mg | ORAL_TABLET | Freq: Every day | ORAL | 0 refills | Status: DC

## 2018-06-20 MED ORDER — CIPROFLOXACIN HCL 0.3 % OP SOLN: 1.0000 [drp] | OPHTHALMIC | 0 refills | Status: AC

## 2018-06-20 MED ORDER — LOSARTAN POTASSIUM 50 MG PO TABS: 50.0000 mg | ORAL_TABLET | Freq: Every day | ORAL | 0 refills | Status: DC

## 2018-06-20 NOTE — Progress Notes (Signed)
Subjective:  By signing my name below, I, Delton Coombes, attest that this documentation has been prepared under the direction and in the presence of Wendie Agreste, MD Electronically Signed: Delton Coombes Medical Scribe 06/20/2018 at 5:00 PM.   Patient ID: Tonya Holland, female    DOB: [birth year], 42 y.o.   MRN: [id number] Chief Complaint  Patient presents with  . Medication Refill    former pt of chelle. (Xanax, Buspar, Celexa, and Cozaar)  . left eye conjuctiveitis    (going for a couple of weeks.- more concern to refills)    HPI Tonya Holland is a 42 y.o. female who presents to Primary Care at [facility] complaining of left eye symptoms. Her PCP is Harrison Mons, PA-C. She is requested refills since previous provider has moved to another office. History of suspected minor headaches, generalized anxiety and hypertension. Resuming Buspar in last visit in January along with Celexa, Xanax if needed.  Patient reports needing medication refill on Celexa, Buspar and Cozaar. Patient reports that she takes one pill in the morning and half a pill at night of Buspar. Patient reports that she went to the pool and both eyes were red, around July 4th. About 2-3 days after that there was a yellow discharge and the eye was closed. She did a Saline wash and cleaned her contacts out. She reports cleaning her contacts as usual, has slept with contacts on before but not recently. She reports that the left eye has discharge, the right eye is okay. She reports that she has worked with people who have had pink eye, but she has never had pink eye. She works at an [employer]. She reported mainly using contacts.     Patient Active Problem List   Diagnosis Date Noted  . Headache 12/07/2017  . Family history of malignant neoplasm of breast 07/15/2014  . Seasonal allergies 05/15/2013  . Multiple nevi 05/15/2013  . Generalized anxiety disorder 03/01/2012  . Hypertension 01/11/2012    Past Medical History:  Diagnosis Date  . Allergy   . Anxiety   . Asthma   . Chronic headache   . Cough 01/11/2012   Followed in Pulmonary clinic/ [facility]/ [place]     - Try off ACEI 01/11/2012    . Hypertension    Past Surgical History:  Procedure Laterality Date  . [place]   right  . breast tumor removed  at age 36   right  . EYE SURGERY     both 1998 and right 1998  . LASIK  at age 49    Allergies  Allergen Reactions  . Ace Inhibitors   . Penicillins     childhood   Prior to Admission medications   Medication Sig Start Date End Date Taking? Authorizing Provider  albuterol (PROVENTIL HFA;VENTOLIN HFA) 108 (90 BASE) MCG/ACT inhaler Inhale 2 puffs into the lungs every 4 (four) hours as needed for wheezing or shortness of breath (cough, shortness of breath or wheezing.). 11/05/15   Darlyne Russian, MD  ALPRAZolam Duanne Moron) 0.5 MG tablet TAKE 1 TABLET BY MOUTH EVERY DAY AS NEEDED FOR ANXIETY 03/06/18   Jeffery, Chelle, PA-C  BLISOVI 24 FE 1-20 MG-MCG(24) tablet Take 1 tablet by mouth daily. 11/28/17   [provider]  busPIRone (BUSPAR) 15 MG tablet Increase total daily dose by 5 mg every 5-7 days up to 15 mg BID. Patient not taking: Reported on 12/07/2017 05/15/17   Harrison Mons, PA-C  Calcium  Carbonate (CALCI-CHEW PO) Take by mouth.    [provider]  cetirizine (ZYRTEC) 10 MG tablet Take 10 mg by mouth as needed.    [provider]  citalopram (CELEXA) 20 MG tablet Take 1 tablet (20 mg total) by mouth daily. 12/07/17   Jeffery, Chelle, PA-C  fluticasone (FLONASE) 50 MCG/ACT nasal spray Place 2 sprays into both nostrils as needed. 01/27/15   Barton Fanny, MD  HYDROcodone-homatropine [facility]) 5-1.5 MG/5ML syrup 15m by mouth a bedtime as needed for cough. Patient not taking: Reported on 12/07/2017 07/31/17   Wendie Agreste, MD  losartan (COZAAR) 50 MG tablet Take 1 tablet (50 mg total) by mouth daily. 12/07/17   Harrison Mons, PA-C   Melatonin-Pyridoxine (MELATIN PO) Take by mouth daily.    [provider]  naproxen (NAPROSYN) 500 MG tablet Take 1 tablet (500 mg total) by mouth as needed. 12/07/17   Harrison Mons, PA-C  vitamin C (ASCORBIC ACID) 500 MG tablet Take 500 mg by mouth as needed.    [provider]   Social History   Socioeconomic History  . Marital status: Single    Spouse name: partner  . Number of children: Y  . Years of education: Not on file  . Highest education level: Not on file  Occupational History  . Occupation: medical hospice social worker    Employer: [employer]  . Financial resource strain: Not on file  . Food insecurity:    Worry: Not on file    Inability: Not on file  . Transportation needs:    Medical: Not on file    Non-medical: Not on file  Tobacco Use  . Smoking status: Never Smoker  . Smokeless tobacco: Never Used  Substance and Sexual Activity  . Alcohol use: Yes    Comment: occasionally wine  . Drug use: No  . Sexual activity: Yes    Birth control/protection: None    Comment: number of sex partners in the last 12 months 1  Lifestyle  . Physical activity:    Days per week: Not on file    Minutes per session: Not on file  . Stress: Not on file  Relationships  . Social connections:    Talks on phone: Not on file    Gets together: Not on file    Attends religious service: Not on file    Active member of club or organization: Not on file    Attends meetings of clubs or organizations: Not on file    Relationship status: Not on file  . Intimate partner violence:    Fear of current or ex partner: Not on file    Emotionally abused: Not on file    Physically abused: Not on file    Forced sexual activity: Not on file  Other Topics Concern  . Not on file  Social History Narrative   Exercise running 1 week ([employer] form) for 30 minutes. Patient has irregular menses.   Review of Systems As per HPI.     Objective:    Physical Exam Vitals:   06/20/18 1610 06/20/18 1612  BP: (!) 144/90 140/80  Pulse: 93   Temp: 98.1 F (36.7 C)   TempSrc: Oral   SpO2: 97%   Weight: 166 lb 3.2 oz (75.4 kg)   Height: 5' 8.5" (1.74 m)    Anterior chamber of eyes clear. Minimal scleral injectiton of the left, although there is no exudate at the canthi.  Lids appear  normal and periorbital area without erythema or soft tissue swelling.      Assessment & Plan:    Tonya Holland is a 42 y.o. female Conjunctivitis of left eye, unspecified conjunctivitis type - Plan: ciprofloxacin (CILOXAN) 0.3 % ophthalmic solution  - possible initial bacterial conjunctivitis but now improving. Less likely caroneal abrasion or ulcer with CL use, but recommended avoidance of CL's for now.   - continue symptomatic care, but Rx for ciloxan given if not continuing to improve.    Essential hypertension - Plan: losartan (COZAAR) 50 MG tablet  -borderline but ok on recheck. meds refilled. Plans on follow up with labs in few weeks.   Generalized anxiety disorder - Plan: citalopram (CELEXA) 20 MG tablet, busPIRone (BUSPAR) 15 MG tablet  - refilled meds above. Has xanax left. Will discuss further in 2 weeks.   Meds ordered this encounter  Medications  . losartan (COZAAR) 50 MG tablet    Sig: Take 1 tablet (50 mg total) by mouth daily.    Dispense:  90 tablet    Refill:  0  . citalopram (CELEXA) 20 MG tablet    Sig: Take 1 tablet (20 mg total) by mouth daily.    Dispense:  90 tablet    Refill:  0  . busPIRone (BUSPAR) 15 MG tablet    Sig: Take 1 tablet (15 mg total) by mouth 2 (two) times daily.    Dispense:  180 tablet    Refill:  0  . ciprofloxacin (CILOXAN) 0.3 % ophthalmic solution    Sig: Place 1 drop into the left eye every 4 (four) hours while awake for 7 days.    Dispense:  5 mL    Refill:  0   Patient Instructions   I refilled your medications today, but please follow up in next 2 weeks for fasting labs and  appointment to discuss those meds further.   Left eye symptoms may have been a bacterial conjunctivitis but does appear to be improving.  Avoid contacts for the next few days, use glasses, okay to use over-the-counter Systane, Blink or Refresh Tears as synthetic eyedrops.  If not continuing to improve, fill the printed prescription for antibiotic drops.  If continued symptoms would recommend follow-up with your eye care provider for further exam.   Thanks for coming in today.    Bacterial Conjunctivitis Bacterial conjunctivitis is an infection of the clear membrane that covers the white part of your eye and the inner surface of your eyelid (conjunctiva). When the blood vessels in your conjunctiva become inflamed, your eye becomes red or pink, and it will probably feel itchy. Bacterial conjunctivitis spreads very easily from person to person (is contagious). It also spreads easily from one eye to the other eye. What are the causes? This condition is caused by several common bacteria. You may get the infection if you come into close contact with another person who is infected. You may also come into contact with items that are contaminated with the bacteria, such as a face towel, contact lens solution, or eye makeup. What increases the risk? This condition is more likely to develop in people who:  Are exposed to other people who have the infection.  Wear contact lenses.  Have a sinus infection.  Have had a recent eye injury or surgery.  Have a weak body defense system (immune system).  Have a medical condition that causes dry eyes.  What are the signs or symptoms? Symptoms of this condition include:  Eye redness.  Tearing or watery eyes.  Itchy eyes.  Burning feeling in your eyes.  Thick, yellowish discharge from an eye. This may turn into a crust on the eyelid overnight and cause your eyelids to stick together.  Swollen eyelids.  Blurred vision.  How is this diagnosed? Your  health care provider can diagnose this condition based on your symptoms and medical history. Your health care provider may also take a sample of discharge from your eye to find the cause of your infection. This is rarely done. How is this treated? Treatment for this condition includes:  Antibiotic eye drops or ointment to clear the infection more quickly and prevent the spread of infection to others.  Oral antibiotic medicines to treat infections that do not respond to drops or ointments, or last longer than 10 days.  Cool, wet cloths (cool compresses) placed on the eyes.  Artificial tears applied 2-6 times a day.  Follow these instructions at home: Medicines  Take or apply your antibiotic medicine as told by your health care provider. Do not stop taking or applying the antibiotic even if you start to feel better.  Take or apply over-the-counter and prescription medicines only as told by your health care provider.  Be very careful to avoid touching the edge of your eyelid with the eye drop bottle or the ointment tube when you apply medicines to the affected eye. This will keep you from spreading the infection to your other eye or to other people. Managing discomfort  Gently wipe away any drainage from your eye with a warm, wet washcloth or a cotton ball.  Apply a cool, clean washcloth to your eye for 10-20 minutes, 3-4 times a day. General instructions  Do not wear contact lenses until the inflammation is gone and your health care provider says it is safe to wear them again. Ask your health care provider how to sterilize or replace your contact lenses before you use them again. Wear glasses until you can resume wearing contacts.  Avoid wearing eye makeup until the inflammation is gone. Throw away any old eye cosmetics that may be contaminated.  Change or wash your pillowcase every day.  Do not share towels or washcloths. This may spread the infection.  Wash your hands often with  soap and water. Use paper towels to dry your hands.  Avoid touching or rubbing your eyes.  Do not drive or use heavy machinery if your vision is blurred. Contact a health care provider if:  You have a fever.  Your symptoms do not get better after 10 days. Get help right away if:  You have a fever and your symptoms suddenly get worse.  You have severe pain when you move your eye.  You have facial pain, redness, or swelling.  You have sudden loss of vision. This information is not intended to replace advice given to you by your health care provider. Make sure you discuss any questions you have with your health care provider. Document Released: 11/20/2005 Document Revised: 03/30/2016 Document Reviewed: 09/02/2015 Elsevier Interactive Patient Education  2017 [employer].      IF you received an x-ray today, you will receive an invoice from [facility]. Please contact [facility] at [phone] with questions or concerns regarding your invoice.   IF you received labwork today, you will receive an invoice from [facility]. Please contact [facility] at [phone] with questions or concerns regarding your invoice.   Our billing staff will not be able to assist you  with questions regarding bills from these companies.  You will be contacted with the lab results as soon as they are available. The fastest way to get your results is to activate your My Chart account. Instructions are located on the last page of this paperwork. If you have not heard from [place] regarding the results in 2 weeks, please contact this office.       I personally performed the services described in this documentation, which was scribed in my presence. The recorded information has been reviewed and considered for accuracy and completeness, addended by me as needed, and agree with information above.  Signed,   Merri Ray, MD [facility] [place].  06/23/18 10:05  PM

## 2018-06-20 NOTE — Patient Instructions (Addendum)
I refilled your medications today, but please follow up in next 2 weeks for fasting labs and appointment to discuss those meds further.   Left eye symptoms may have been a bacterial conjunctivitis but does appear to be improving.  Avoid contacts for the next few days, use glasses, okay to use over-the-counter Systane, Blink or Refresh Tears as synthetic eyedrops.  If not continuing to improve, fill the printed prescription for antibiotic drops.  If continued symptoms would recommend follow-up with your eye care provider for further exam.   Thanks for coming in today.    Bacterial Conjunctivitis Bacterial conjunctivitis is an infection of the clear membrane that covers the white part of your eye and the inner surface of your eyelid (conjunctiva). When the blood vessels in your conjunctiva become inflamed, your eye becomes red or pink, and it will probably feel itchy. Bacterial conjunctivitis spreads very easily from person to person (is contagious). It also spreads easily from one eye to the other eye. What are the causes? This condition is caused by several common bacteria. You may get the infection if you come into close contact with another person who is infected. You may also come into contact with items that are contaminated with the bacteria, such as a face towel, contact lens solution, or eye makeup. What increases the risk? This condition is more likely to develop in people who:  Are exposed to other people who have the infection.  Wear contact lenses.  Have a sinus infection.  Have had a recent eye injury or surgery.  Have a weak body defense system (immune system).  Have a medical condition that causes dry eyes.  What are the signs or symptoms? Symptoms of this condition include:  Eye redness.  Tearing or watery eyes.  Itchy eyes.  Burning feeling in your eyes.  Thick, yellowish discharge from an eye. This may turn into a crust on the eyelid overnight and cause your  eyelids to stick together.  Swollen eyelids.  Blurred vision.  How is this diagnosed? Your health care provider can diagnose this condition based on your symptoms and medical history. Your health care provider may also take a sample of discharge from your eye to find the cause of your infection. This is rarely done. How is this treated? Treatment for this condition includes:  Antibiotic eye drops or ointment to clear the infection more quickly and prevent the spread of infection to others.  Oral antibiotic medicines to treat infections that do not respond to drops or ointments, or last longer than 10 days.  Cool, wet cloths (cool compresses) placed on the eyes.  Artificial tears applied 2-6 times a day.  Follow these instructions at home: Medicines  Take or apply your antibiotic medicine as told by your health care provider. Do not stop taking or applying the antibiotic even if you start to feel better.  Take or apply over-the-counter and prescription medicines only as told by your health care provider.  Be very careful to avoid touching the edge of your eyelid with the eye drop bottle or the ointment tube when you apply medicines to the affected eye. This will keep you from spreading the infection to your other eye or to other people. Managing discomfort  Gently wipe away any drainage from your eye with a warm, wet washcloth or a cotton ball.  Apply a cool, clean washcloth to your eye for 10-20 minutes, 3-4 times a day. General instructions  Do not wear contact lenses until the  inflammation is gone and your health care provider says it is safe to wear them again. Ask your health care provider how to sterilize or replace your contact lenses before you use them again. Wear glasses until you can resume wearing contacts.  Avoid wearing eye makeup until the inflammation is gone. Throw away any old eye cosmetics that may be contaminated.  Change or wash your pillowcase every  day.  Do not share towels or washcloths. This may spread the infection.  Wash your hands often with soap and water. Use paper towels to dry your hands.  Avoid touching or rubbing your eyes.  Do not drive or use heavy machinery if your vision is blurred. Contact a health care provider if:  You have a fever.  Your symptoms do not get better after 10 days. Get help right away if:  You have a fever and your symptoms suddenly get worse.  You have severe pain when you move your eye.  You have facial pain, redness, or swelling.  You have sudden loss of vision. This information is not intended to replace advice given to you by your health care provider. Make sure you discuss any questions you have with your health care provider. Document Released: 11/20/2005 Document Revised: 03/30/2016 Document Reviewed: 09/02/2015 Elsevier Interactive Patient Education  2017 [employer].      IF you received an x-ray today, you will receive an invoice from [facility]. Please contact [facility] at [phone] with questions or concerns regarding your invoice.   IF you received labwork today, you will receive an invoice from [facility]. Please contact [facility] at [phone] with questions or concerns regarding your invoice.   Our billing staff will not be able to assist you with questions regarding bills from these companies.  You will be contacted with the lab results as soon as they are available. The fastest way to get your results is to activate your My Chart account. Instructions are located on the last page of this paperwork. If you have not heard from [place] regarding the results in 2 weeks, please contact this office.

## 2018-07-08 ENCOUNTER — Ambulatory Visit: Payer: BLUE CROSS/BLUE SHIELD | Admitting: Family Medicine

## 2018-07-08 ENCOUNTER — Encounter: Payer: Self-pay | Admitting: Family Medicine

## 2018-07-08 ENCOUNTER — Other Ambulatory Visit: Payer: Self-pay

## 2018-07-08 VITALS — BP 102/68 | HR 94 | Temp 98.5°F | Ht 68.5 in | Wt 164.4 lb

## 2018-07-08 DIAGNOSIS — F411 Generalized anxiety disorder: Secondary | ICD-10-CM

## 2018-07-08 DIAGNOSIS — E785 Hyperlipidemia, unspecified: Secondary | ICD-10-CM

## 2018-07-08 DIAGNOSIS — G43009 Migraine without aura, not intractable, without status migrainosus: Secondary | ICD-10-CM | POA: Diagnosis not present

## 2018-07-08 DIAGNOSIS — I10 Essential (primary) hypertension: Secondary | ICD-10-CM

## 2018-07-08 LAB — COMPREHENSIVE METABOLIC PANEL
ALBUMIN: 3.9 g/dL (ref 3.5–5.5)
ALT: 19 IU/L (ref 0–32)
AST: 18 IU/L (ref 0–40)
Albumin/Globulin Ratio: 1.9 (ref 1.2–2.2)
Alkaline Phosphatase: 56 IU/L (ref 39–117)
BUN / CREAT RATIO: 17 (ref 9–23)
BUN: 13 mg/dL (ref 6–24)
Bilirubin Total: 0.6 mg/dL (ref 0.0–1.2)
CO2: 24 mmol/L (ref 20–29)
CREATININE: 0.76 mg/dL (ref 0.57–1.00)
Calcium: 9.3 mg/dL (ref 8.7–10.2)
Chloride: 103 mmol/L (ref 96–106)
GFR calc non Af Amer: 96 mL/min/{1.73_m2} (ref >59)
GFR, EST AFRICAN AMERICAN: 111 mL/min/{1.73_m2} (ref >59)
GLOBULIN, TOTAL: 2.1 g/dL (ref 1.5–4.5)
GLUCOSE: 89 mg/dL (ref 65–99)
Potassium: 4.4 mmol/L (ref 3.5–5.2)
SODIUM: 141 mmol/L (ref 134–144)
Total Protein: 6 g/dL (ref 6.0–8.5)

## 2018-07-08 LAB — LIPID PANEL
Chol/HDL Ratio: 2.9 ratio (ref 0.0–4.4)
Cholesterol, Total: 167 mg/dL (ref 100–199)
HDL: 58 mg/dL (ref >39)
LDL CALC: 89 mg/dL (ref 0–99)
Triglycerides: 100 mg/dL (ref 0–149)
VLDL CHOLESTEROL CAL: 20 mg/dL (ref 5–40)

## 2018-07-08 MED ORDER — SUMATRIPTAN SUCCINATE 50 MG PO TABS: 50.0000 mg | ORAL_TABLET | Freq: Once | ORAL | 1 refills | Status: AC

## 2018-07-08 MED ORDER — ONDANSETRON 4 MG PO TBDP: 4.0000 mg | ORAL_TABLET | Freq: Three times a day (TID) | ORAL | 0 refills | Status: AC | PRN

## 2018-07-08 MED ORDER — ALPRAZOLAM 0.5 MG PO TABS: 0.5000 mg | ORAL_TABLET | Freq: Two times a day (BID) | ORAL | 1 refills | Status: AC | PRN

## 2018-07-08 MED ORDER — CITALOPRAM HYDROBROMIDE 20 MG PO TABS: 20.0000 mg | ORAL_TABLET | Freq: Every day | ORAL | 1 refills | Status: AC

## 2018-07-08 MED ORDER — LOSARTAN POTASSIUM 50 MG PO TABS: 50.0000 mg | ORAL_TABLET | Freq: Every day | ORAL | 1 refills | Status: AC

## 2018-07-08 MED ORDER — BUSPIRONE HCL 15 MG PO TABS: 15.0000 mg | ORAL_TABLET | Freq: Two times a day (BID) | ORAL | 1 refills | Status: AC
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
## 2018-07-08 NOTE — Patient Instructions (Addendum)
No change in meds for now, continue approaches to help with driving anxiety, but would consider meeting with psychologist again in future to see if other techniques may help.  For migraine headaches, okay to try Imitrex at the onset of headache, repeat once in 2 hours as needed.  Zofran if needed for nausea or vomiting.  Try to decrease caffeine by 1 drink per day for now then in the next few weeks try additional decrease in caffeine.  See other information below on migraines and prevention strategies.  Recheck in 6 to 8 weeks to see how things are going.   Return to the clinic or go to the nearest emergency room if any of your symptoms worsen or new symptoms occur.   Migraine Headache A migraine headache is an intense, throbbing pain on one side or both sides of the head. Migraines may also cause other symptoms, such as nausea, vomiting, and sensitivity to light and noise. What are the causes? Doing or taking certain things may also trigger migraines, such as:  Alcohol.  Smoking.  Medicines, such as: ? Medicine used to treat chest pain (nitroglycerine). ? Birth control pills. ? Estrogen pills. ? Certain blood pressure medicines.  Aged cheeses, chocolate, or caffeine.  Foods or drinks that contain nitrates, glutamate, aspartame, or tyramine.  Physical activity.  Other things that may trigger a migraine include:  Menstruation.  Pregnancy.  Hunger.  Stress, lack of sleep, too much sleep, or fatigue.  Weather changes.  What increases the risk? The following factors may make you more likely to experience migraine headaches:  Age. Risk increases with age.  Family history of migraine headaches.  Being Caucasian.  Depression and anxiety.  Obesity.  Being a woman.  Having a hole in the heart (patent foramen ovale) or other heart problems.  What are the signs or symptoms? The main symptom of this condition is pulsating or throbbing pain. Pain may:  Happen in any  area of the head, such as on one side or both sides.  Interfere with daily activities.  Get worse with physical activity.  Get worse with exposure to bright lights or loud noises.  Other symptoms may include:  Nausea.  Vomiting.  Dizziness.  General sensitivity to bright lights, loud noises, or smells.  Before you get a migraine, you may get warning signs that a migraine is developing (aura). An aura may include:  Seeing flashing lights or having blind spots.  Seeing bright spots, halos, or zigzag lines.  Having tunnel vision or blurred vision.  Having numbness or a tingling feeling.  Having trouble talking.  Having muscle weakness.  How is this diagnosed? A migraine headache can be diagnosed based on:  Your symptoms.  A physical exam.  Tests, such as CT scan or MRI of the head. These imaging tests can help rule out other causes of headaches.  Taking fluid from the spine (lumbar puncture) and analyzing it (cerebrospinal fluid analysis, or CSF analysis).  How is this treated? A migraine headache is usually treated with medicines that:  Relieve pain.  Relieve nausea.  Prevent migraines from coming back.  Treatment may also include:  Acupuncture.  Lifestyle changes like avoiding foods that trigger migraines.  Follow these instructions at home: Medicines  Take over-the-counter and prescription medicines only as told by your health care provider.  Do not drive or use heavy machinery while taking prescription pain medicine.  To prevent or treat constipation while you are taking prescription pain medicine, your health care  provider may recommend that you: ? Drink enough fluid to keep your urine clear or pale yellow. ? Take over-the-counter or prescription medicines. ? Eat foods that are high in fiber, such as fresh fruits and vegetables, whole grains, and beans. ? Limit foods that are high in fat and processed sugars, such as fried and sweet  foods. Lifestyle  Avoid alcohol use.  Do not use any products that contain nicotine or tobacco, such as cigarettes and e-cigarettes. If you need help quitting, ask your health care provider.  Get at least 8 hours of sleep every night.  Limit your stress. General instructions   Keep a journal to find out what may trigger your migraine headaches. For example, write down: ? What you eat and drink. ? How much sleep you get. ? Any change to your diet or medicines.  If you have a migraine: ? Avoid things that make your symptoms worse, such as bright lights. ? It may help to lie down in a dark, quiet room. ? Do not drive or use heavy machinery. ? Ask your health care provider what activities are safe for you while you are experiencing symptoms.  Keep all follow-up visits as told by your health care provider. This is important. Contact a health care provider if:  You develop symptoms that are different or more severe than your usual migraine symptoms. Get help right away if:  Your migraine becomes severe.  You have a fever.  You have a stiff neck.  You have vision loss.  Your muscles feel weak or like you cannot control them.  You start to lose your balance often.  You develop trouble walking.  You faint. This information is not intended to replace advice given to you by your health care provider. Make sure you discuss any questions you have with your health care provider. Document Released: 11/20/2005 Document Revised: 06/09/2016 Document Reviewed: 05/08/2016 Elsevier Interactive Patient Education  2017 [place]. Sumatriptan tablets What is this medicine? SUMATRIPTAN (soo ma TRIP tan) is used to treat migraines with or without aura. An aura is a strange feeling or visual disturbance that warns you of an attack. It is not used to prevent migraines. This medicine may be used for other purposes; ask your health care provider or pharmacist if you have questions. COMMON  BRAND NAME(S): Imitrex, Migraine Pack What should I tell my health care provider before I take this medicine? They need to know if you have any of these conditions: -circulation problems in fingers and toes -diabetes -heart disease -high blood pressure -high cholesterol -history of irregular heartbeat -history of stroke -kidney disease -liver disease -postmenopausal or surgical removal of uterus and ovaries -seizures -smoke tobacco -stomach or intestine problems -an unusual or allergic reaction to sumatriptan, other medicines, foods, dyes, or preservatives -pregnant or trying to get pregnant -breast-feeding How should I use this medicine? Take this medicine by mouth with a glass of water. Follow the directions on the prescription label. This medicine is taken at the first symptoms of a migraine. It is not for everyday use. If your migraine headache returns after one dose, you can take another dose as directed. You must leave at least 2 hours between doses, and do not take more than 100 mg as a single dose. Do not take more than 200 mg total in any 24 hour period. If there is no improvement at all after the first dose, do not take a second dose without talking to your doctor or health care  professional. Do not take your medicine more often than directed. Talk to your pediatrician regarding the use of this medicine in children. Special care may be needed. Overdosage: If you think you have taken too much of this medicine contact a poison control center or emergency room at once. NOTE: This medicine is only for you. Do not share this medicine with others. What if I miss a dose? This does not apply; this medicine is not for regular use. What may interact with this medicine? Do not take this medicine with any of the following medicines: -cocaine -ergot alkaloids like dihydroergotamine, ergonovine, ergotamine, methylergonovine -feverfew -MAOIs like Carbex, Eldepryl, Marplan, Nardil, and  Parnate -other medicines for migraine headache like almotriptan, eletriptan, frovatriptan, naratriptan, rizatriptan, zolmitriptan -tryptophan This medicine may also interact with the following medications: -certain medicines for depression, anxiety, or psychotic disturbances This list may not describe all possible interactions. Give your health care provider a list of all the medicines, herbs, non-prescription drugs, or dietary supplements you use. Also tell them if you smoke, drink alcohol, or use illegal drugs. Some items may interact with your medicine. What should I watch for while using this medicine? Only take this medicine for a migraine headache. Take it if you get warning symptoms or at the start of a migraine attack. It is not for regular use to prevent migraine attacks. You may get drowsy or dizzy. Do not drive, use machinery, or do anything that needs mental alertness until you know how this medicine affects you. To reduce dizzy or fainting spells, do not sit or stand up quickly, especially if you are an older patient. Alcohol can increase drowsiness, dizziness and flushing. Avoid alcoholic drinks. Smoking cigarettes may increase the risk of heart-related side effects from using this medicine. If you take migraine medicines for 10 or more days a month, your migraines may get worse. Keep a diary of headache days and medicine use. Contact your healthcare professional if your migraine attacks occur more frequently. What side effects may I notice from receiving this medicine? Side effects that you should report to your doctor or health care professional as soon as possible: -allergic reactions like skin rash, itching or hives, swelling of the face, lips, or tongue -bloody or watery diarrhea -hallucination, loss of contact with reality -pain, tingling, numbness in the face, hands, or feet -seizures -signs and symptoms of a blood clot such as breathing problems; changes in vision; chest pain;  severe, sudden headache; pain, swelling, warmth in the leg; trouble speaking; sudden numbness or weakness of the face, arm, or leg -signs and symptoms of a dangerous change in heartbeat or heart rhythm like chest pain; dizziness; fast or irregular heartbeat; palpitations, feeling faint or lightheaded; falls; breathing problems -signs and symptoms of a stroke like changes in vision; confusion; trouble speaking or understanding; severe headaches; sudden numbness or weakness of the face, arm, or leg; trouble walking; dizziness; loss of balance or coordination -stomach pain Side effects that usually do not require medical attention (report to your doctor or health care professional if they continue or are bothersome): -changes in taste -facial flushing -headache -muscle cramps -muscle pain -nausea, vomiting -weak or tired This list may not describe all possible side effects. Call your doctor for medical advice about side effects. You may report side effects to FDA at 1-800-FDA-1088. Where should I keep my medicine? Keep out of the reach of children. Store at room temperature between 2 and 30 degrees C (36 and 86 degrees F). Throw away  any unused medicine after the expiration date. NOTE: This sheet is a summary. It may not cover all possible information. If you have questions about this medicine, talk to your doctor, pharmacist, or health care provider.  2018 Elsevier/Gold Standard (2015-12-23 12:38:23)   IF you received an x-ray today, you will receive an invoice from [facility]. Please contact [facility] at [phone] with questions or concerns regarding your invoice.   IF you received labwork today, you will receive an invoice from [facility]. Please contact [facility] at [phone] with questions or concerns regarding your invoice.   Our billing staff will not be able to assist you with questions regarding bills from these companies.  You will be contacted with the lab  results as soon as they are available. The fastest way to get your results is to activate your My Chart account. Instructions are located on the last page of this paperwork. If you have not heard from [place] regarding the results in 2 weeks, please contact this office.

## 2018-08-22 ENCOUNTER — Ambulatory Visit: Payer: BLUE CROSS/BLUE SHIELD | Admitting: Family Medicine

## 2019-03-09 ENCOUNTER — Other Ambulatory Visit: Payer: Self-pay | Admitting: Family Medicine

## 2019-03-09 DIAGNOSIS — F411 Generalized anxiety disorder: Secondary | ICD-10-CM

## 2019-09-02 ENCOUNTER — Other Ambulatory Visit: Payer: Self-pay

## 2019-09-02 DIAGNOSIS — Z20822 Contact with and (suspected) exposure to covid-19: Secondary | ICD-10-CM

## 2019-09-03 LAB — NOVEL CORONAVIRUS, NAA: SARS-CoV-2, NAA: NOT DETECTED

## 2019-11-04 ENCOUNTER — Other Ambulatory Visit: Payer: Self-pay

## 2019-11-04 DIAGNOSIS — Z20822 Contact with and (suspected) exposure to covid-19: Secondary | ICD-10-CM

## 2019-11-06 LAB — NOVEL CORONAVIRUS, NAA: SARS-CoV-2, NAA: NOT DETECTED

## 2019-12-01 ENCOUNTER — Ambulatory Visit: Payer: BC Managed Care – PPO | Attending: Internal Medicine

## 2019-12-01 DIAGNOSIS — Z20822 Contact with and (suspected) exposure to covid-19: Secondary | ICD-10-CM

## 2019-12-03 LAB — NOVEL CORONAVIRUS, NAA: SARS-CoV-2, NAA: NOT DETECTED

## 2020-02-23 ENCOUNTER — Other Ambulatory Visit: Payer: Self-pay | Admitting: Obstetrics and Gynecology

## 2020-02-23 DIAGNOSIS — R928 Other abnormal and inconclusive findings on diagnostic imaging of breast: Secondary | ICD-10-CM

## 2020-02-26 ENCOUNTER — Other Ambulatory Visit: Payer: Self-pay

## 2020-02-26 ENCOUNTER — Other Ambulatory Visit: Payer: Self-pay | Admitting: Obstetrics and Gynecology

## 2020-02-26 ENCOUNTER — Ambulatory Visit
Admission: RE | Admit: 2020-02-26 | Discharge: 2020-02-26 | Disposition: A | Discharge status: Home / Self Care | Payer: BC Managed Care – PPO | Source: Ambulatory Visit | Attending: Obstetrics and Gynecology | Admitting: Obstetrics and Gynecology

## 2020-02-26 DIAGNOSIS — R928 Other abnormal and inconclusive findings on diagnostic imaging of breast: Secondary | ICD-10-CM

## 2020-02-26 DIAGNOSIS — N6310 Unspecified lump in the right breast, unspecified quadrant: Secondary | ICD-10-CM

## 2020-03-02 ENCOUNTER — Other Ambulatory Visit: Payer: Self-pay

## 2020-03-02 ENCOUNTER — Ambulatory Visit
Admission: RE | Admit: 2020-03-02 | Discharge: 2020-03-02 | Disposition: A | Discharge status: Home / Self Care | Payer: BC Managed Care – PPO | Source: Ambulatory Visit | Attending: Obstetrics and Gynecology | Admitting: Obstetrics and Gynecology

## 2020-03-02 ENCOUNTER — Ambulatory Visit
Admission: RE | Admit: 2020-03-02 | Discharge: 2020-03-02 | Disposition: A | Discharge status: Home / Self Care | Payer: Self-pay | Source: Ambulatory Visit | Attending: Obstetrics and Gynecology | Admitting: Obstetrics and Gynecology

## 2020-03-02 DIAGNOSIS — N6310 Unspecified lump in the right breast, unspecified quadrant: Secondary | ICD-10-CM

## 2020-07-18 IMAGING — MG MM BREAST LOCALIZATION CLIP
4 series · 4 of 12 positions shown · non-contrast
Comparison: Previous exam(s).

CLINICAL DATA: Status post ultrasound-guided core biopsy of mass in
the 6 o'clock location of the RIGHT breast.

EXAM:
DIAGNOSTIC RIGHT MAMMOGRAM POST ULTRASOUND BIOPSY

[R CC synth-2D]
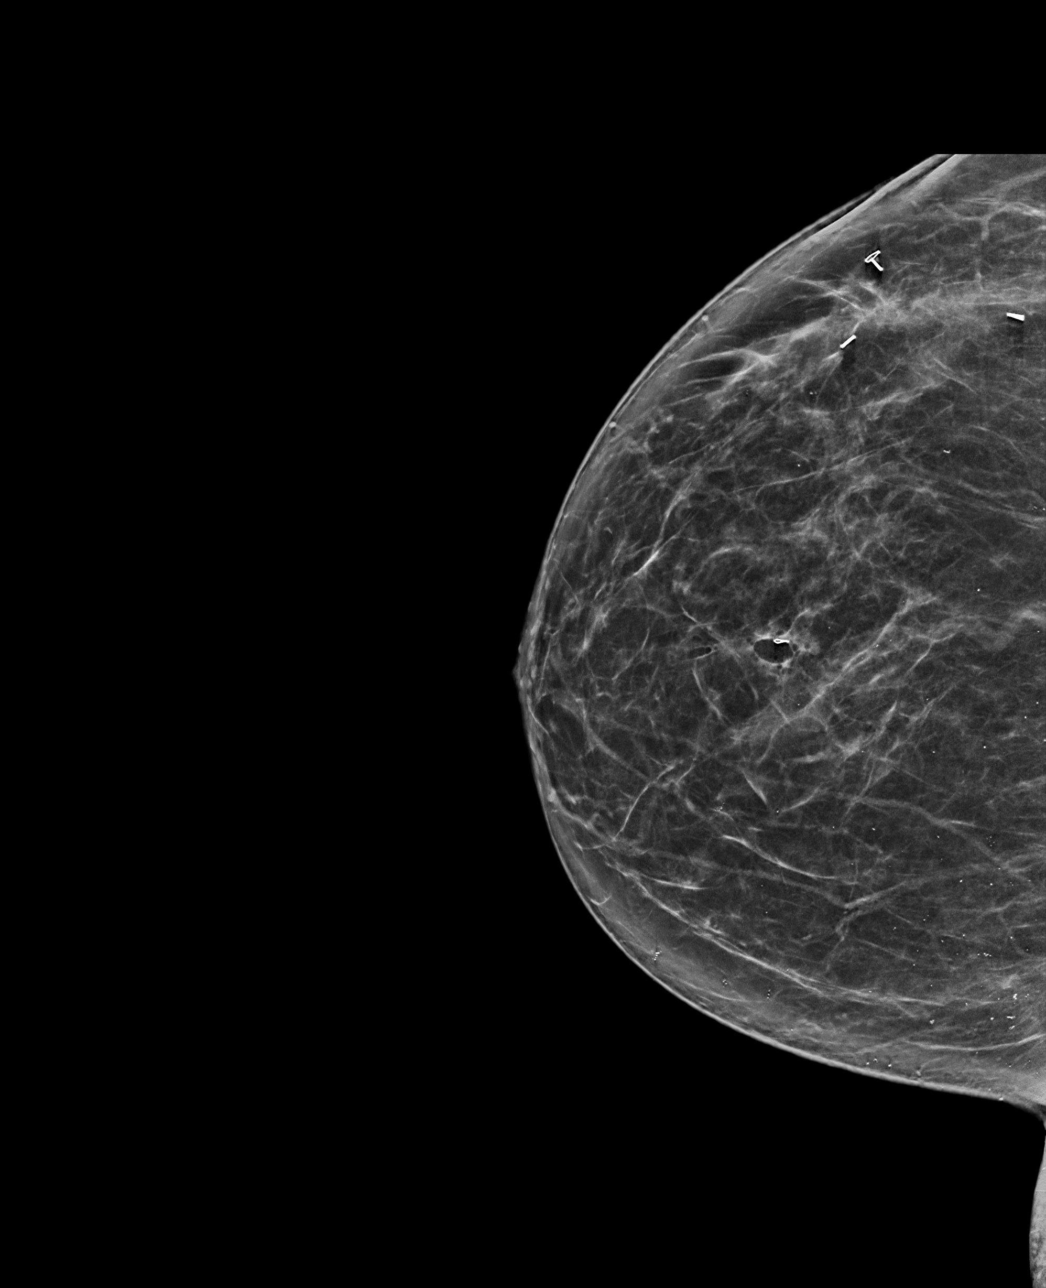

[R ML synth-2D]
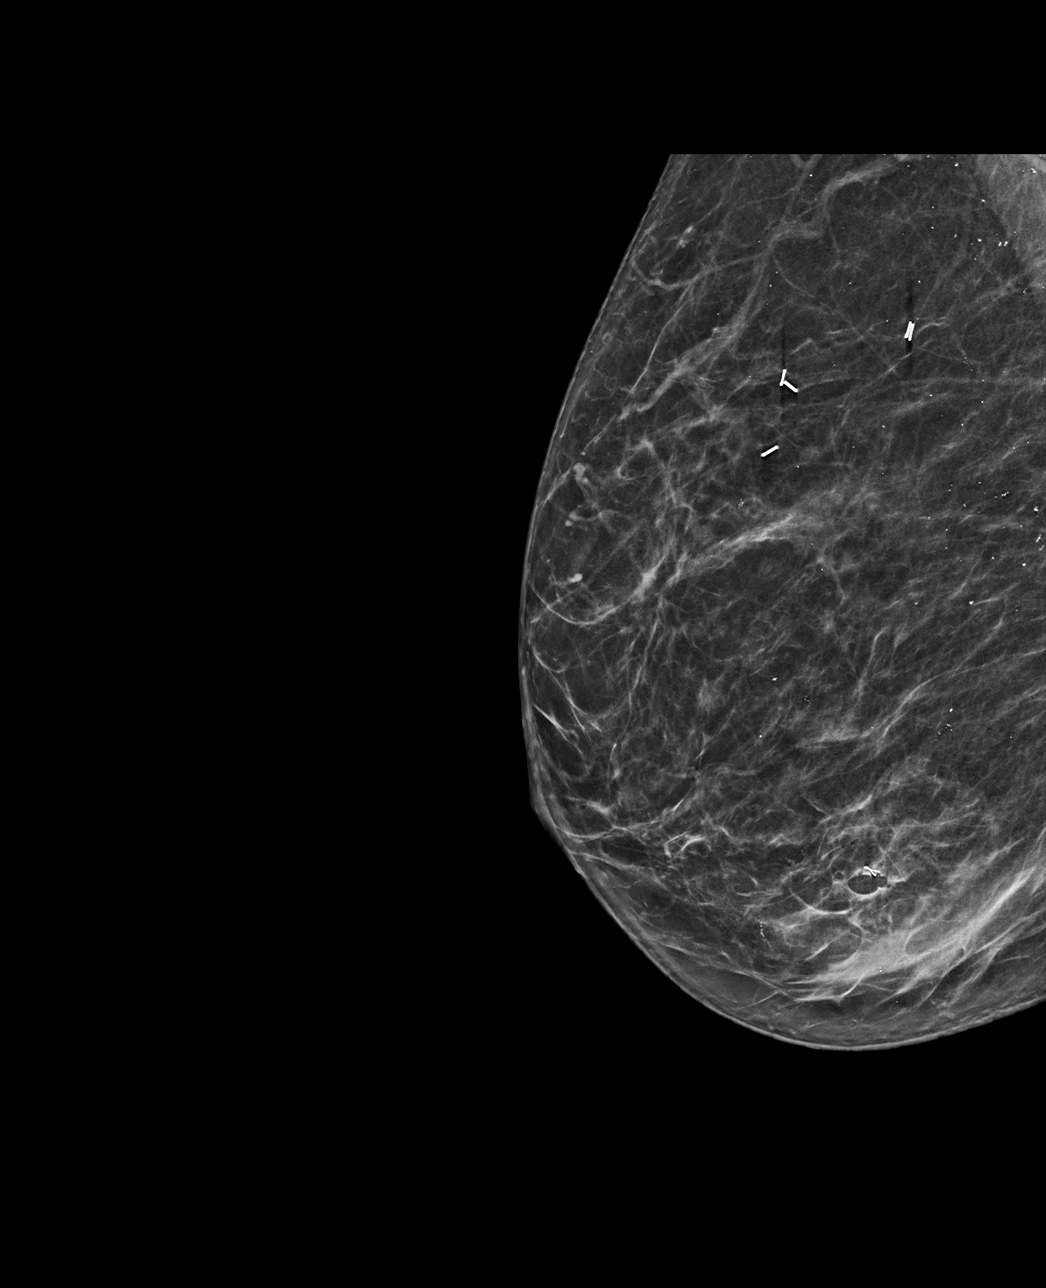

[R ML tomo · tomo slice 37/72.0]
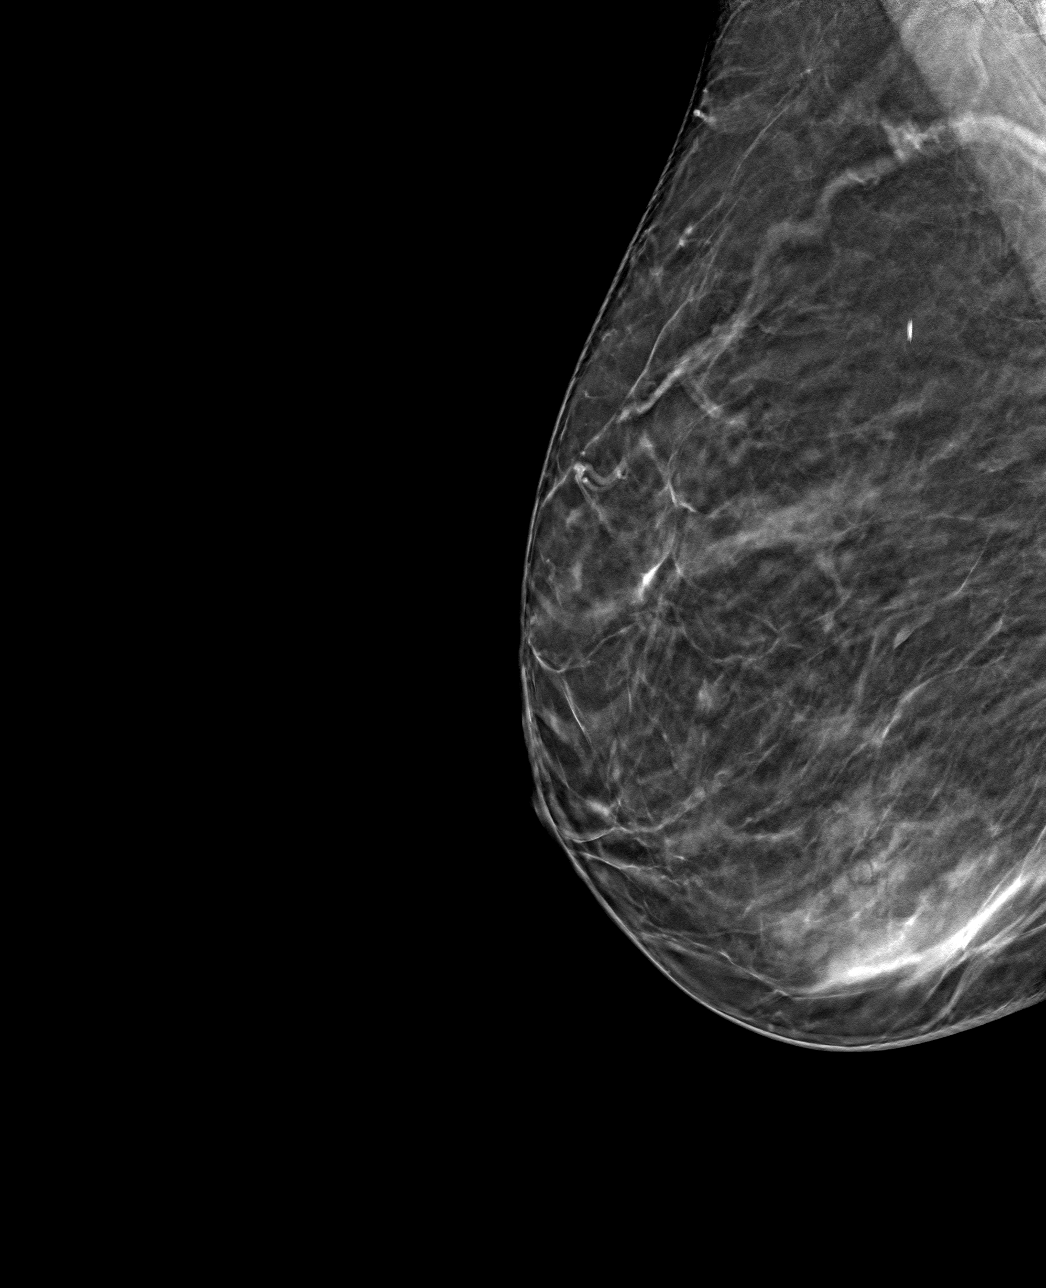

[R CC tomo · tomo slice 37/74.0]
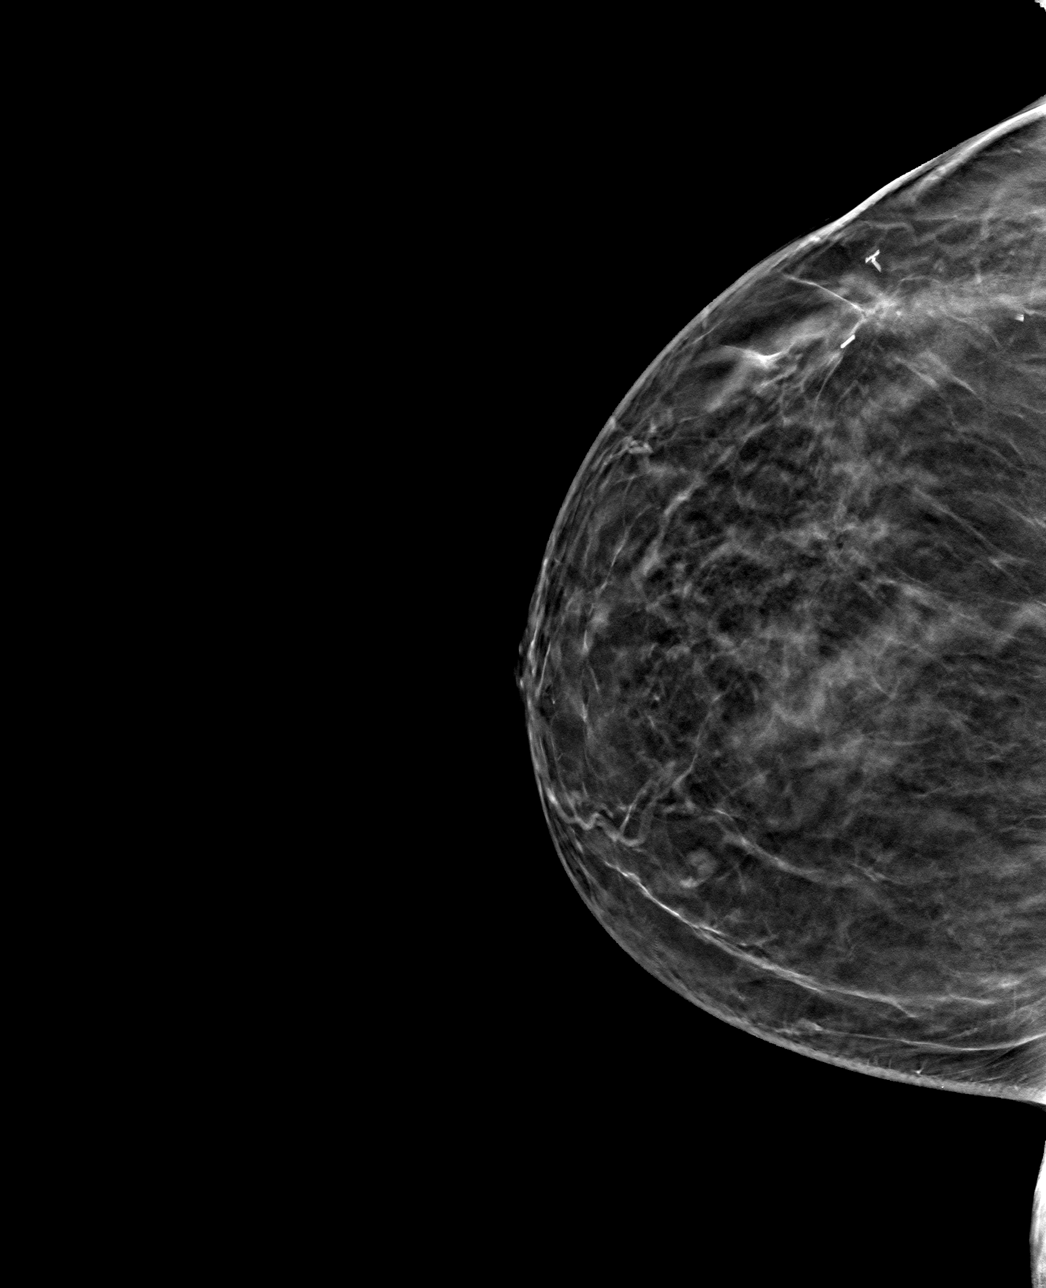

[4 of 12 positions shown; findings below may reference images not displayed]

FINDINGS: Mammographic images were obtained following ultrasound guided biopsy
of mass in the 6 o'clock location of the RIGHT breast and placement
of a ribbon shaped clip. The biopsy marking clip is in expected
position at the site of biopsy in the LOWER central portion of the
RIGHT breast.
IMPRESSION: Appropriate positioning of the ribbon shaped biopsy marking clip at
the site of biopsy in the LOWER central RIGHT breast.

Final Assessment: Post Procedure Mammograms for Marker Placement

## 2020-07-18 IMAGING — US US  BREAST BX W/ LOC DEV 1ST LESION IMG BX SPEC US GUIDE*R*
1 series · 8 of 8 positions shown · non-contrast
Comparison: Previous exam(s).
COMPARISON: Previous exam(s).

Addendum:
CLINICAL DATA: Patient presents for ultrasound-guided core biopsy
of mass in the 6 o'clock location of the RIGHT breast.

EXAM:
ULTRASOUND GUIDED RIGHT BREAST CORE NEEDLE BIOPSY

[Series 1: us breast bx w/ loc dev 1st lesion img bx spec us  · 0.06mm/px · 8 of 8 slices shown]
[im 1/8]
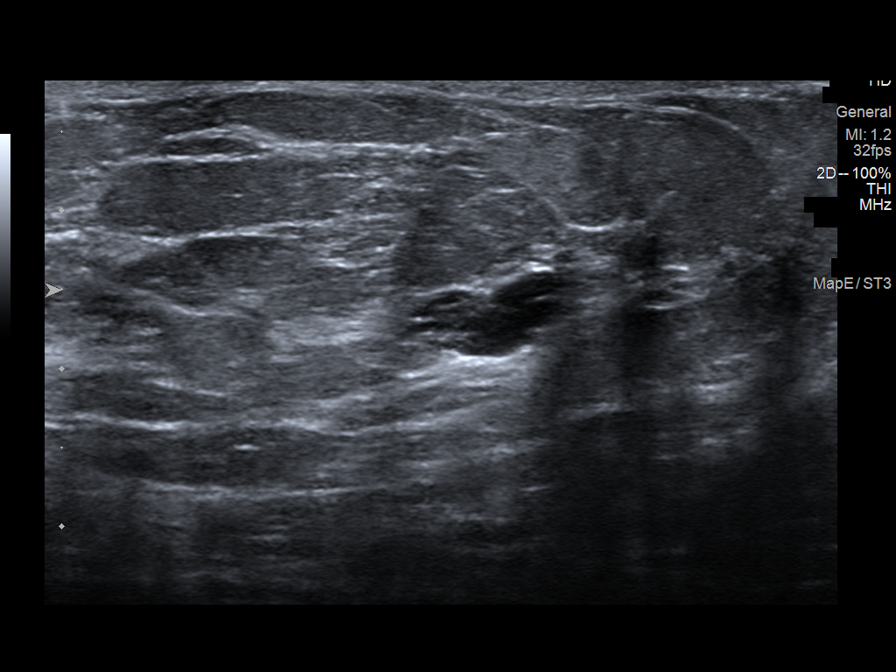
[im 2/8]
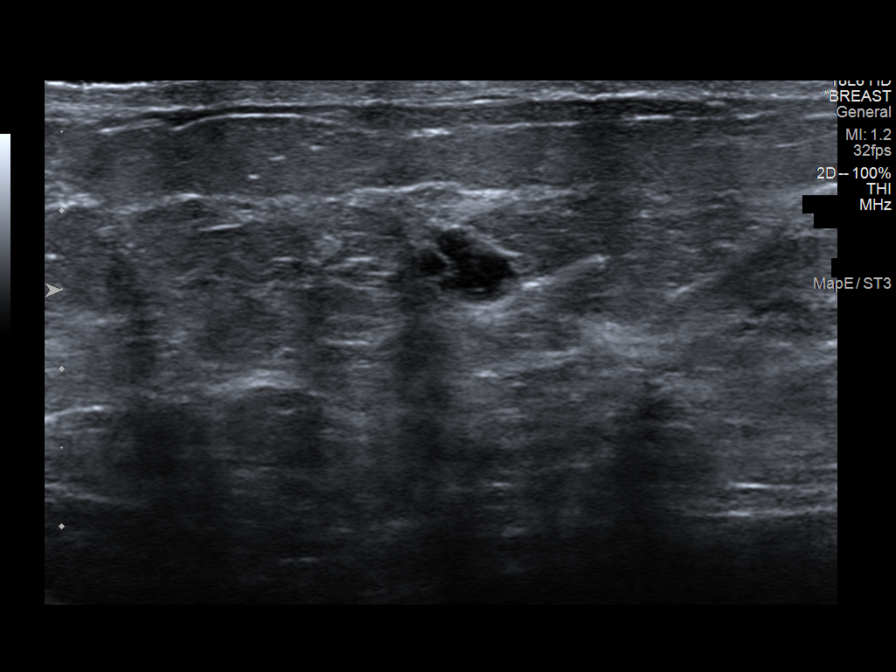
[im 3/8]
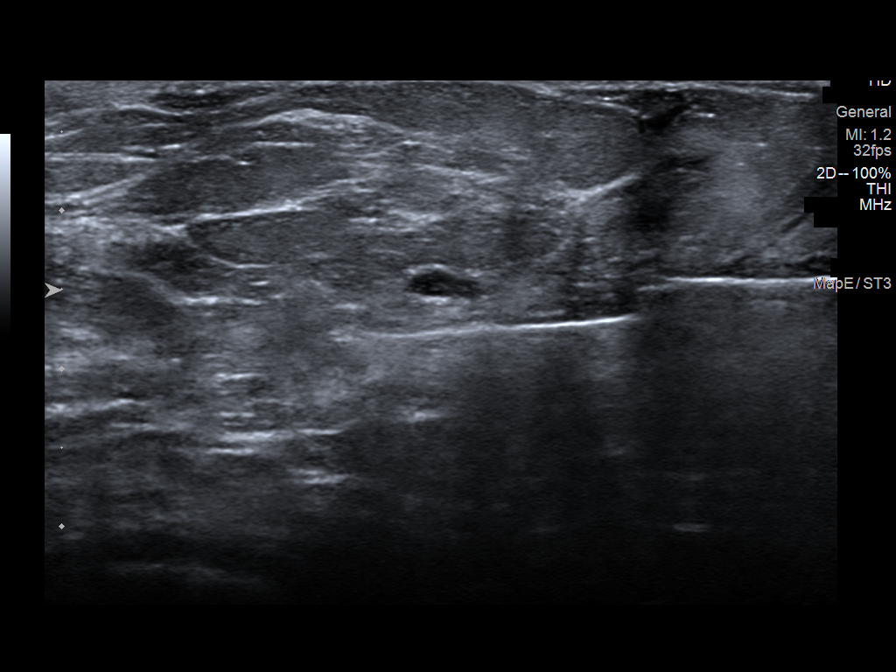
[im 4/8]
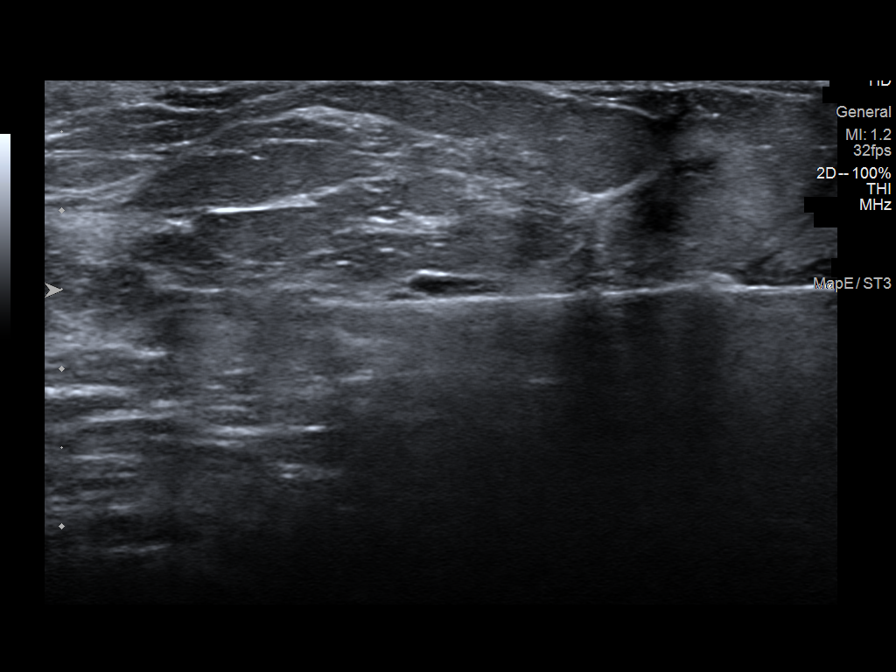
[im 5/8]
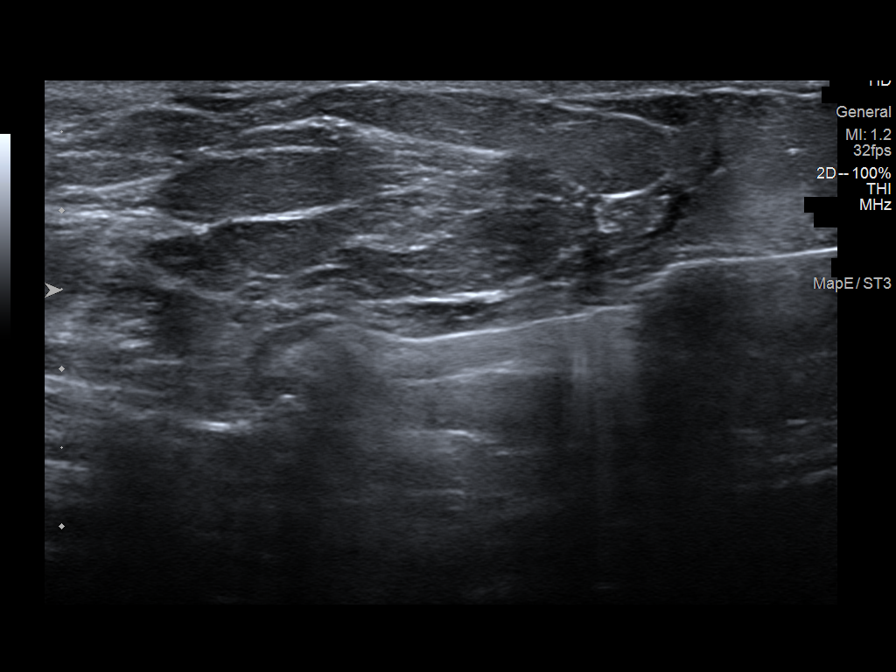
[im 6/8]
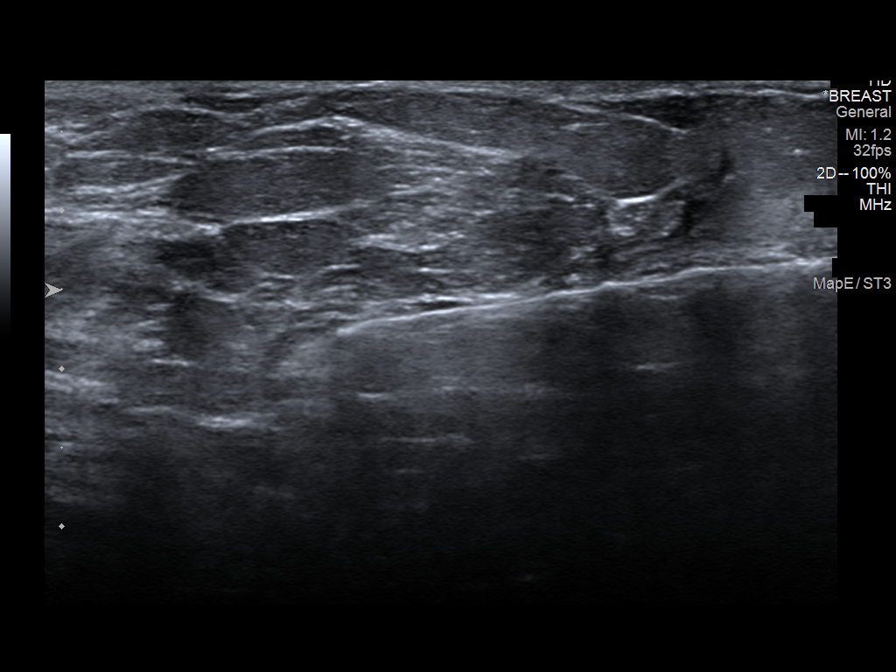
[im 7/8]
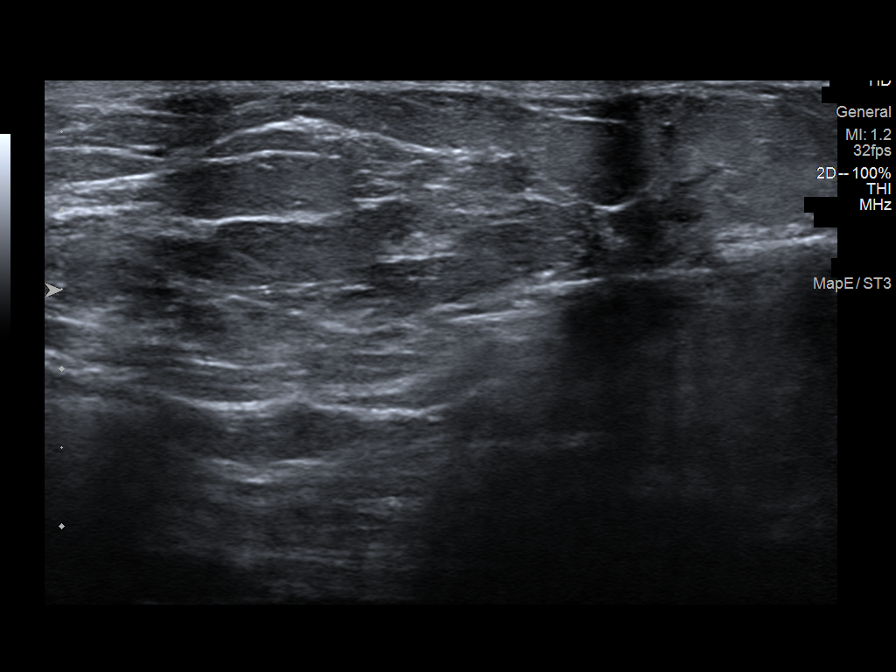
[im 8/8]
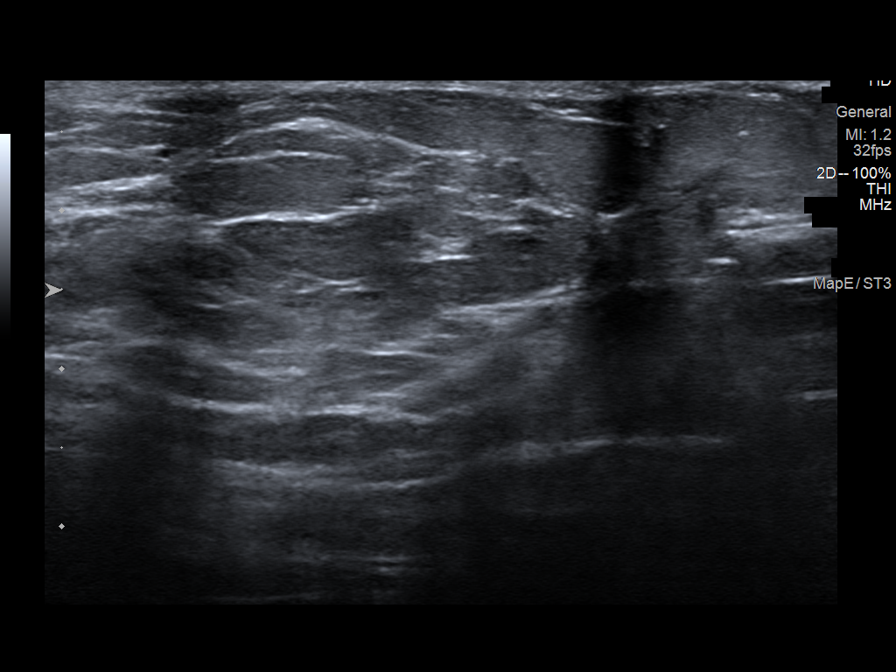

[8 of 8 positions shown; findings below may reference images not displayed]



Lesion quadrant: 6 o'clock location RIGHT breast

Using sterile technique and 1% Lidocaine as local anesthetic, under
direct ultrasound visualization, a 12 gauge Ceejay device was
used to perform biopsy of mass in the 6 o'clock location of the
RIGHT breast using a inferior to superior approach. At the
conclusion of the procedure ribbon shaped tissue marker clip was
deployed into the biopsy cavity. Follow up 2 view mammogram was
performed and dictated separately.
IMPRESSION: Ultrasound guided biopsy of RIGHT breast mass. No apparent
complications.

ADDENDUM:
Pathology revealed FIBROCYSTIC CHANGES of the RIGHT breast, 6
o'clock, ribbon clip. This was found to be concordant by Dr.
Pozisa Tiger.

Pathology results were discussed with the patient by telephone. The
patient reported doing well after the biopsy with tenderness at the
site. Post biopsy instructions and care were reviewed and questions
were answered. The patient was encouraged to call The [REDACTED]

The patient was instructed to return for annual screening
mammography at [REDACTED] and informed a
reminder notice would be sent regarding this appointment.

Pathology results reported by Olawale Suriel RN on 03/03/2020.



Lesion quadrant: 6 o'clock location RIGHT breast

Using sterile technique and 1% Lidocaine as local anesthetic, under
direct ultrasound visualization, a 12 gauge Ceejay device was
used to perform biopsy of mass in the 6 o'clock location of the
RIGHT breast using a inferior to superior approach. At the
conclusion of the procedure ribbon shaped tissue marker clip was
deployed into the biopsy cavity. Follow up 2 view mammogram was
performed and dictated separately.
IMPRESSION: Ultrasound guided biopsy of RIGHT breast mass. No apparent
complications.

## 2021-03-24 DIAGNOSIS — I10 Essential (primary) hypertension: Secondary | ICD-10-CM | POA: Diagnosis not present

## 2021-03-24 DIAGNOSIS — E282 Polycystic ovarian syndrome: Secondary | ICD-10-CM | POA: Diagnosis not present

## 2021-03-24 DIAGNOSIS — R635 Abnormal weight gain: Secondary | ICD-10-CM | POA: Diagnosis not present

## 2021-03-24 DIAGNOSIS — N941 Unspecified dyspareunia: Secondary | ICD-10-CM | POA: Diagnosis not present

## 2021-03-25 ENCOUNTER — Other Ambulatory Visit (HOSPITAL_BASED_OUTPATIENT_CLINIC_OR_DEPARTMENT_OTHER): Payer: Self-pay

## 2021-03-25 MED ORDER — LOSARTAN POTASSIUM 50 MG PO TABS
1.0000 | ORAL_TABLET | Freq: Every day | ORAL | 3 refills | Status: AC
  Filled 2021-03-25: qty 90, 90d supply, fill #0
  Filled 2021-09-22: qty 90, 90d supply, fill #1

## 2021-03-25 MED ORDER — ALPRAZOLAM 0.5 MG PO TABS
ORAL_TABLET | ORAL | 5 refills | Status: AC
  Filled 2021-03-25: qty 30, 30d supply, fill #0

## 2021-03-25 MED ORDER — SPIRONOLACTONE 50 MG PO TABS
50.0000 mg | ORAL_TABLET | Freq: Every day | ORAL | 3 refills | Status: AC
  Filled 2021-03-25: qty 90, 90d supply, fill #0

## 2021-03-25 MED ORDER — PHENTERMINE HCL 37.5 MG PO TABS
37.5000 mg | ORAL_TABLET | Freq: Every day | ORAL | 2 refills | Status: DC
  Filled 2021-03-25: qty 30, 30d supply, fill #0
  Filled 2021-04-22: qty 30, 30d supply, fill #1
  Filled 2021-05-27: qty 30, 30d supply, fill #2

## 2021-03-25 MED ORDER — TOPIRAMATE 25 MG PO TABS
ORAL_TABLET | ORAL | 1 refills | Status: AC
  Filled 2021-03-25: qty 180, 90d supply, fill #0

## 2021-03-25 MED ORDER — CITALOPRAM HYDROBROMIDE 20 MG PO TABS
20.0000 mg | ORAL_TABLET | Freq: Every day | ORAL | 1 refills | Status: DC
  Filled 2021-03-25: qty 90, 90d supply, fill #0
  Filled 2021-07-11: qty 90, 90d supply, fill #1

## 2021-03-25 MED ORDER — BUSPIRONE HCL 15 MG PO TABS
ORAL_TABLET | ORAL | 3 refills | Status: AC
  Filled 2021-03-25: qty 135, 90d supply, fill #0

## 2021-03-25 MED ORDER — ESTRADIOL 0.1 MG/GM VA CREA
TOPICAL_CREAM | VAGINAL | 2 refills | Status: AC
  Filled 2021-03-25: qty 30, 7d supply, fill #0

## 2021-04-22 ENCOUNTER — Other Ambulatory Visit (HOSPITAL_BASED_OUTPATIENT_CLINIC_OR_DEPARTMENT_OTHER): Payer: Self-pay

## 2021-04-25 ENCOUNTER — Other Ambulatory Visit (HOSPITAL_BASED_OUTPATIENT_CLINIC_OR_DEPARTMENT_OTHER): Payer: Self-pay

## 2021-05-15 DIAGNOSIS — H18892 Other specified disorders of cornea, left eye: Secondary | ICD-10-CM | POA: Diagnosis not present

## 2021-05-27 ENCOUNTER — Other Ambulatory Visit (HOSPITAL_BASED_OUTPATIENT_CLINIC_OR_DEPARTMENT_OTHER): Payer: Self-pay

## 2021-06-28 ENCOUNTER — Other Ambulatory Visit (HOSPITAL_BASED_OUTPATIENT_CLINIC_OR_DEPARTMENT_OTHER): Payer: Self-pay

## 2021-06-29 ENCOUNTER — Other Ambulatory Visit (HOSPITAL_BASED_OUTPATIENT_CLINIC_OR_DEPARTMENT_OTHER): Payer: Self-pay

## 2021-07-11 ENCOUNTER — Other Ambulatory Visit (HOSPITAL_BASED_OUTPATIENT_CLINIC_OR_DEPARTMENT_OTHER): Payer: Self-pay

## 2021-07-14 DIAGNOSIS — E8881 Metabolic syndrome: Secondary | ICD-10-CM | POA: Diagnosis not present

## 2021-07-14 DIAGNOSIS — R5383 Other fatigue: Secondary | ICD-10-CM | POA: Diagnosis not present

## 2021-07-14 DIAGNOSIS — E559 Vitamin D deficiency, unspecified: Secondary | ICD-10-CM | POA: Diagnosis not present

## 2021-07-14 DIAGNOSIS — E7800 Pure hypercholesterolemia, unspecified: Secondary | ICD-10-CM | POA: Diagnosis not present

## 2021-07-14 DIAGNOSIS — R635 Abnormal weight gain: Secondary | ICD-10-CM | POA: Diagnosis not present

## 2021-07-14 DIAGNOSIS — Z131 Encounter for screening for diabetes mellitus: Secondary | ICD-10-CM | POA: Diagnosis not present

## 2021-07-15 ENCOUNTER — Other Ambulatory Visit (HOSPITAL_BASED_OUTPATIENT_CLINIC_OR_DEPARTMENT_OTHER): Payer: Self-pay

## 2021-07-15 MED ORDER — TOPIRAMATE 50 MG PO TABS
50.0000 mg | ORAL_TABLET | Freq: Two times a day (BID) | ORAL | 0 refills | Status: DC
  Filled 2021-07-15: qty 60, 30d supply, fill #0

## 2021-07-15 MED ORDER — PHENTERMINE HCL 37.5 MG PO TABS
37.5000 mg | ORAL_TABLET | Freq: Every day | ORAL | 0 refills | Status: DC
  Filled 2021-07-15: qty 30, 30d supply, fill #0

## 2021-08-01 DIAGNOSIS — Z6825 Body mass index (BMI) 25.0-25.9, adult: Secondary | ICD-10-CM | POA: Diagnosis not present

## 2021-08-01 DIAGNOSIS — Z1231 Encounter for screening mammogram for malignant neoplasm of breast: Secondary | ICD-10-CM | POA: Diagnosis not present

## 2021-08-01 DIAGNOSIS — Z01419 Encounter for gynecological examination (general) (routine) without abnormal findings: Secondary | ICD-10-CM | POA: Diagnosis not present

## 2021-08-04 ENCOUNTER — Other Ambulatory Visit (HOSPITAL_COMMUNITY): Payer: Self-pay

## 2021-08-04 MED ORDER — ESTRADIOL 0.1 MG/GM VA CREA
0.5000 g | TOPICAL_CREAM | VAGINAL | 4 refills | Status: AC
  Filled 2021-08-04: qty 42.5, 85d supply, fill #0

## 2021-08-18 DIAGNOSIS — R635 Abnormal weight gain: Secondary | ICD-10-CM | POA: Diagnosis not present

## 2021-08-18 DIAGNOSIS — E559 Vitamin D deficiency, unspecified: Secondary | ICD-10-CM | POA: Diagnosis not present

## 2021-08-18 DIAGNOSIS — Z6825 Body mass index (BMI) 25.0-25.9, adult: Secondary | ICD-10-CM | POA: Diagnosis not present

## 2021-08-18 DIAGNOSIS — F5081 Binge eating disorder: Secondary | ICD-10-CM | POA: Diagnosis not present

## 2021-08-19 ENCOUNTER — Other Ambulatory Visit (HOSPITAL_BASED_OUTPATIENT_CLINIC_OR_DEPARTMENT_OTHER): Payer: Self-pay

## 2021-08-19 MED ORDER — PHENTERMINE HCL 37.5 MG PO TABS
37.5000 mg | ORAL_TABLET | Freq: Every day | ORAL | 0 refills | Status: AC
  Filled 2021-08-19: qty 30, 30d supply, fill #0

## 2021-08-19 MED ORDER — TOPIRAMATE 50 MG PO TABS
50.0000 mg | ORAL_TABLET | Freq: Two times a day (BID) | ORAL | 0 refills | Status: DC
  Filled 2021-08-19: qty 60, 30d supply, fill #0

## 2021-08-19 MED ORDER — VITAMIN D (ERGOCALCIFEROL) 1.25 MG (50000 UNIT) PO CAPS
ORAL_CAPSULE | ORAL | 3 refills | Status: AC
  Filled 2021-08-19: qty 4, 28d supply, fill #0
  Filled 2021-10-07: qty 4, 28d supply, fill #1
  Filled 2022-01-06 – 2022-06-13 (×3): qty 4, 28d supply, fill #2

## 2021-09-22 ENCOUNTER — Other Ambulatory Visit (HOSPITAL_BASED_OUTPATIENT_CLINIC_OR_DEPARTMENT_OTHER): Payer: Self-pay

## 2021-09-22 MED ORDER — CITALOPRAM HYDROBROMIDE 20 MG PO TABS
20.0000 mg | ORAL_TABLET | Freq: Every day | ORAL | 0 refills | Status: DC
  Filled 2021-09-22: qty 90, 90d supply, fill #0

## 2021-09-23 ENCOUNTER — Encounter (HOSPITAL_BASED_OUTPATIENT_CLINIC_OR_DEPARTMENT_OTHER): Payer: Self-pay | Admitting: Pharmacist

## 2021-09-23 ENCOUNTER — Other Ambulatory Visit (HOSPITAL_BASED_OUTPATIENT_CLINIC_OR_DEPARTMENT_OTHER): Payer: Self-pay

## 2021-10-06 ENCOUNTER — Other Ambulatory Visit (HOSPITAL_BASED_OUTPATIENT_CLINIC_OR_DEPARTMENT_OTHER): Payer: Self-pay

## 2021-10-06 DIAGNOSIS — R635 Abnormal weight gain: Secondary | ICD-10-CM | POA: Diagnosis not present

## 2021-10-06 DIAGNOSIS — F5081 Binge eating disorder: Secondary | ICD-10-CM | POA: Diagnosis not present

## 2021-10-06 DIAGNOSIS — Z6825 Body mass index (BMI) 25.0-25.9, adult: Secondary | ICD-10-CM | POA: Diagnosis not present

## 2021-10-06 DIAGNOSIS — E559 Vitamin D deficiency, unspecified: Secondary | ICD-10-CM | POA: Diagnosis not present

## 2021-10-06 MED ORDER — VYVANSE 30 MG PO CAPS
ORAL_CAPSULE | ORAL | 0 refills | Status: AC
  Filled 2021-10-06: qty 30, 30d supply, fill #0

## 2021-10-06 MED ORDER — VYVANSE 30 MG PO CAPS
ORAL_CAPSULE | ORAL | 0 refills | Status: AC
  Filled 2021-11-03: qty 30, 30d supply, fill #0

## 2021-10-07 ENCOUNTER — Other Ambulatory Visit (HOSPITAL_BASED_OUTPATIENT_CLINIC_OR_DEPARTMENT_OTHER): Payer: Self-pay

## 2021-11-03 ENCOUNTER — Other Ambulatory Visit (HOSPITAL_BASED_OUTPATIENT_CLINIC_OR_DEPARTMENT_OTHER): Payer: Self-pay

## 2021-11-22 ENCOUNTER — Ambulatory Visit (HOSPITAL_COMMUNITY)
Admission: EM | Admit: 2021-11-22 | Discharge: 2021-11-22 | Disposition: A | Discharge status: Home / Self Care | Payer: 59 | Attending: Urgent Care | Admitting: Urgent Care

## 2021-11-22 ENCOUNTER — Ambulatory Visit (INDEPENDENT_AMBULATORY_CARE_PROVIDER_SITE_OTHER): Payer: 59

## 2021-11-22 ENCOUNTER — Other Ambulatory Visit: Payer: Self-pay

## 2021-11-22 ENCOUNTER — Other Ambulatory Visit (HOSPITAL_BASED_OUTPATIENT_CLINIC_OR_DEPARTMENT_OTHER): Payer: Self-pay

## 2021-11-22 ENCOUNTER — Encounter (HOSPITAL_COMMUNITY): Payer: Self-pay

## 2021-11-22 DIAGNOSIS — B349 Viral infection, unspecified: Secondary | ICD-10-CM

## 2021-11-22 DIAGNOSIS — R059 Cough, unspecified: Secondary | ICD-10-CM | POA: Diagnosis not present

## 2021-11-22 DIAGNOSIS — J4520 Mild intermittent asthma, uncomplicated: Secondary | ICD-10-CM

## 2021-11-22 DIAGNOSIS — R052 Subacute cough: Secondary | ICD-10-CM

## 2021-11-22 LAB — POC INFLUENZA A AND B ANTIGEN (URGENT CARE ONLY)
INFLUENZA A ANTIGEN, POC: NEGATIVE
INFLUENZA B ANTIGEN, POC: NEGATIVE

## 2021-11-22 MED ORDER — PROMETHAZINE-DM 6.25-15 MG/5ML PO SYRP
5.0000 mL | ORAL_SOLUTION | Freq: Every evening | ORAL | 0 refills | Status: AC | PRN
  Filled 2021-11-22: qty 100, 20d supply, fill #0

## 2021-11-22 MED ORDER — ALBUTEROL SULFATE HFA 108 (90 BASE) MCG/ACT IN AERS
2.0000 | INHALATION_SPRAY | RESPIRATORY_TRACT | 1 refills | Status: AC | PRN
  Filled 2021-11-22: qty 18, 17d supply, fill #0

## 2021-11-22 MED ORDER — BENZONATATE 100 MG PO CAPS
100.0000 mg | ORAL_CAPSULE | Freq: Three times a day (TID) | ORAL | 0 refills | Status: AC | PRN
  Filled 2021-11-22: qty 60, 10d supply, fill #0

## 2021-11-22 MED ORDER — PREDNISONE 20 MG PO TABS
ORAL_TABLET | ORAL | 0 refills | Status: AC
  Filled 2021-11-22: qty 10, 5d supply, fill #0

## 2021-11-22 NOTE — ED Provider Notes (Signed)
[place]   MRN: [id number] DOB: August 10, 1976  Subjective:   Tonya Holland is a 45 y.o. female presenting for 3-day history of persistent coughing, coughing fits, sinus congestion and chills. No sick contacts directly. Had 1 sick contact at work that tested positive for the flu and her employer wanted her tested for this.  Has a history of asthma but needs an inhaler as she does not have it.  No chest pain, shortness of breath or wheezing, ear pain, ear drainage, body aches, fevers.  Patient is not a smoker.  No current facility-administered medications for this encounter.  Current Outpatient Medications:    albuterol (PROVENTIL HFA;VENTOLIN HFA) 108 (90 BASE) MCG/ACT inhaler, Inhale 2 puffs into the lungs every 4 (four) hours as needed for wheezing or shortness of breath (cough, shortness of breath or wheezing.)., Disp: 1 Inhaler, Rfl: 1   ALPRAZolam (XANAX) 0.5 MG tablet, Take 1 tablet (0.5 mg total) by mouth 2 (two) times daily as needed for anxiety., Disp: 30 tablet, Rfl: 1   ALPRAZolam (XANAX) 0.5 MG tablet, Take 1 (one) Tablet daily as needed for anxiety, Disp: 30 tablet, Rfl: 5   BLISOVI 24 FE 1-20 MG-MCG(24) tablet, Take 1 tablet by mouth daily., Disp: , Rfl: 12   busPIRone (BUSPAR) 15 MG tablet, Take 1 tablet (15 mg total) by mouth 2 (two) times daily., Disp: 180 tablet, Rfl: 1   busPIRone (BUSPAR) 15 MG tablet, Take 1 and 1/2 (one and a half) tablets by mouth daily, Disp: 135 tablet, Rfl: 3   Calcium Carbonate (CALCI-CHEW PO), Take by mouth., Disp: , Rfl:    cetirizine (ZYRTEC) 10 MG tablet, Take 10 mg by mouth as needed., Disp: , Rfl:    citalopram (CELEXA) 20 MG tablet, Take 1 tablet (20 mg total) by mouth daily., Disp: 90 tablet, Rfl: 1   citalopram (CELEXA) 20 MG tablet, Take 1 tablet by mouth daily., Disp: 90 tablet, Rfl: 0   estradiol (ESTRACE) 0.1 MG/GM vaginal cream, 2 (two) gram to vagina as directed, max daily dose: 2 Gram, Disp: 30 g, Rfl: 2    estradiol (ESTRACE) 0.1 MG/GM vaginal cream, Insert 0.5 g vaginally 2 (two) times a week., Disp: 42.5 g, Rfl: 4   fluticasone (FLONASE) 50 MCG/ACT nasal spray, Place 2 sprays into both nostrils as needed., Disp: 16 g, Rfl: 4   lisdexamfetamine (VYVANSE) 30 MG capsule, Take 1 capsule by mouth once daily., Disp: 30 capsule, Rfl: 0   lisdexamfetamine (VYVANSE) 30 MG capsule, Take 1 capsule by mouth once daily., Disp: 30 capsule, Rfl: 0   losartan (COZAAR) 50 MG tablet, Take 1 tablet (50 mg total) by mouth daily., Disp: 90 tablet, Rfl: 1   losartan (COZAAR) 50 MG tablet, Take 1 tablet by mouth daily, Disp: 90 tablet, Rfl: 3   Melatonin-Pyridoxine (MELATIN PO), Take by mouth daily., Disp: , Rfl:    naproxen (NAPROSYN) 500 MG tablet, Take 1 tablet (500 mg total) by mouth as needed., Disp: 60 tablet, Rfl: 2   ondansetron (ZOFRAN ODT) 4 MG disintegrating tablet, Take 1 tablet (4 mg total) by mouth every 8 (eight) hours as needed for nausea or vomiting., Disp: 10 tablet, Rfl: 0   phentermine (ADIPEX-P) 37.5 MG tablet, Take 1 tablet by mouth daily, Disp: 30 tablet, Rfl: 0   spironolactone (ALDACTONE) 50 MG tablet, Take 1 tablet by mouth daily, Disp: 90 tablet, Rfl: 3   SUMAtriptan (IMITREX) 50 MG tablet, Take 1 tablet (50 mg total) by  mouth once for 1 dose. May repeat in 2 hours if headache persists or recurs., Disp: 6 tablet, Rfl: 1   topiramate (TOPAMAX) 25 MG tablet, Take 1 (one) tablet by mouth two times daily, Disp: 180 tablet, Rfl: 1   topiramate (TOPAMAX) 50 MG tablet, Take 1 tablet by mouth twice daily., Disp: 60 tablet, Rfl: 0   vitamin C (ASCORBIC ACID) 500 MG tablet, Take 500 mg by mouth as needed., Disp: , Rfl:    Vitamin D, Ergocalciferol, (DRISDOL) 1.25 MG (50000 UNIT) CAPS capsule, Take 1 capsule by mouth once weekly., Disp: 4 capsule, Rfl: 3   Allergies  Allergen Reactions   Ace Inhibitors    Penicillins     childhood    Past Medical History:  Diagnosis Date   Allergy    Anxiety     Asthma    Chronic headache    Cough 01/11/2012   Followed in Pulmonary clinic/ [facility]/ [place]     - Try off ACEI 01/11/2012     Hypertension      Past Surgical History:  Procedure Laterality Date   BREAST SURGERY  1999   right   breast tumor removed  at age 3   right   [place] and right 23   LASIK  at age 65     Family History  Problem Relation Age of Onset   Allergies Mother    Asthma Mother    Breast cancer Mother 25   Alzheimer's disease Mother 30       moderately severe   Hypertension Father    Cancer Paternal Grandmother        breast   Breast cancer Paternal Grandmother 43   Breast cancer Maternal Aunt 23       Stage 3 or 4 breast cancer   Breast cancer Paternal Aunt 64       Breast cancer   Other Maternal Grandmother        died in her 23s    Social History   Tobacco Use   Smoking status: Never   Smokeless tobacco: Never  Substance Use Topics   Alcohol use: Yes    Comment: occasionally wine   Drug use: No    ROS   Objective:   Vitals: BP 135/80 (BP Location: Right Arm)    Pulse 98    Temp 98.9 F (37.2 C) (Oral)    Resp 18    LMP 11/01/2014    SpO2 98%   Physical Exam Constitutional:      General: She is not in acute distress.    Appearance: Normal appearance. She is well-developed. She is not ill-appearing, toxic-appearing or diaphoretic.  HENT:     Head: Normocephalic and atraumatic.     Right Ear: Tympanic membrane and ear canal normal. No drainage or tenderness. No middle ear effusion. Tympanic membrane is not erythematous.     Left Ear: Tympanic membrane and ear canal normal. No drainage or tenderness.  No middle ear effusion. Tympanic membrane is not erythematous.     Nose: Congestion present. No rhinorrhea.     Mouth/Throat:     Mouth: Mucous membranes are moist. No oral lesions.     Pharynx: No pharyngeal swelling, oropharyngeal exudate, posterior oropharyngeal erythema or uvula swelling.     Tonsils: No  tonsillar exudate or tonsillar abscesses.  Eyes:     Extraocular Movements: Extraocular movements intact.     Right eye: Normal extraocular motion.  Left eye: Normal extraocular motion.     Conjunctiva/sclera: Conjunctivae normal.     Pupils: Pupils are equal, round, and reactive to light.  Cardiovascular:     Rate and Rhythm: Normal rate and regular rhythm.     Pulses: Normal pulses.     Heart sounds: Normal heart sounds. No murmur heard.   No friction rub. No gallop.  Pulmonary:     Effort: Pulmonary effort is normal. No respiratory distress.     Breath sounds: No stridor. Rhonchi (Left-sided) present. No wheezing or rales.  Musculoskeletal:     Cervical back: Normal range of motion and neck supple.  Lymphadenopathy:     Cervical: No cervical adenopathy.  Skin:    General: Skin is warm and dry.     Findings: No rash.  Neurological:     General: No focal deficit present.     Mental Status: She is alert and oriented to person, place, and time.  Psychiatric:        Mood and Affect: Mood normal.        Behavior: Behavior normal.        Thought Content: Thought content normal.    Results for orders placed or performed during the hospital encounter of 11/22/21 (from the past 24 hour(s))  POC Influenza A & B Ag (Urgent Care)     Status: None   Collection Time: 11/22/21 10:54 AM  Result Value Ref Range   INFLUENZA A ANTIGEN, POC NEGATIVE NEGATIVE   INFLUENZA B ANTIGEN, POC NEGATIVE NEGATIVE   DG Chest 2 View  Result Date: 11/22/2021 CLINICAL DATA:  Cough, congestion EXAM: CHEST - 2 VIEW COMPARISON:  11/05/2015 FINDINGS: The heart size and mediastinal contours are within normal limits. Both lungs are clear. The visualized skeletal structures are unremarkable. IMPRESSION: No acute abnormality of the lungs. Electronically Signed   By: Delanna Ahmadi M.D.   On: 11/22/2021 11:10    Assessment and Plan :   PDMP not reviewed this encounter.  1. Viral syndrome   2. Subacute cough    3. Mild intermittent asthma, uncomplicated    In light of her asthma, pulmonary exam we will use an oral prednisone course.  I refilled her albuterol inhaler, recommended supportive care otherwise for an acute viral syndrome.  She tested negative for flu and is afebrile so I will provide her with a note for work to return tomorrow. Counseled patient on potential for adverse effects with medications prescribed/recommended today, ER and return-to-clinic precautions discussed, patient verbalized understanding.    Jaynee Eagles, [place] 11/22/21 1115

## 2021-11-22 NOTE — ED Triage Notes (Signed)
Pt presents with chills, congestion, and sore throat X 3 days.

## 2021-12-02 ENCOUNTER — Other Ambulatory Visit (HOSPITAL_BASED_OUTPATIENT_CLINIC_OR_DEPARTMENT_OTHER): Payer: Self-pay

## 2021-12-05 ENCOUNTER — Other Ambulatory Visit (HOSPITAL_BASED_OUTPATIENT_CLINIC_OR_DEPARTMENT_OTHER): Payer: Self-pay

## 2021-12-05 ENCOUNTER — Encounter (HOSPITAL_BASED_OUTPATIENT_CLINIC_OR_DEPARTMENT_OTHER): Payer: Self-pay | Admitting: Pharmacist

## 2021-12-07 ENCOUNTER — Other Ambulatory Visit (HOSPITAL_BASED_OUTPATIENT_CLINIC_OR_DEPARTMENT_OTHER): Payer: Self-pay

## 2021-12-10 DIAGNOSIS — R635 Abnormal weight gain: Secondary | ICD-10-CM | POA: Diagnosis not present

## 2021-12-10 DIAGNOSIS — F5081 Binge eating disorder: Secondary | ICD-10-CM | POA: Diagnosis not present

## 2021-12-10 DIAGNOSIS — L299 Pruritus, unspecified: Secondary | ICD-10-CM | POA: Diagnosis not present

## 2021-12-10 DIAGNOSIS — E559 Vitamin D deficiency, unspecified: Secondary | ICD-10-CM | POA: Diagnosis not present

## 2022-01-06 ENCOUNTER — Other Ambulatory Visit (HOSPITAL_BASED_OUTPATIENT_CLINIC_OR_DEPARTMENT_OTHER): Payer: Self-pay

## 2022-01-09 ENCOUNTER — Other Ambulatory Visit (HOSPITAL_BASED_OUTPATIENT_CLINIC_OR_DEPARTMENT_OTHER): Payer: Self-pay

## 2022-01-09 ENCOUNTER — Encounter (HOSPITAL_BASED_OUTPATIENT_CLINIC_OR_DEPARTMENT_OTHER): Payer: Self-pay | Admitting: Pharmacist

## 2022-01-09 MED ORDER — CITALOPRAM HYDROBROMIDE 20 MG PO TABS
20.0000 mg | ORAL_TABLET | Freq: Every day | ORAL | 0 refills | Status: DC
  Filled 2022-01-09: qty 90, 90d supply, fill #0

## 2022-01-09 MED ORDER — TOPIRAMATE 50 MG PO TABS
50.0000 mg | ORAL_TABLET | Freq: Two times a day (BID) | ORAL | 0 refills | Status: DC
  Filled 2022-01-09: qty 60, 30d supply, fill #0

## 2022-01-10 ENCOUNTER — Other Ambulatory Visit (HOSPITAL_BASED_OUTPATIENT_CLINIC_OR_DEPARTMENT_OTHER): Payer: Self-pay

## 2022-01-10 ENCOUNTER — Encounter (HOSPITAL_BASED_OUTPATIENT_CLINIC_OR_DEPARTMENT_OTHER): Payer: Self-pay

## 2022-01-19 ENCOUNTER — Other Ambulatory Visit (HOSPITAL_BASED_OUTPATIENT_CLINIC_OR_DEPARTMENT_OTHER): Payer: Self-pay

## 2022-02-03 DIAGNOSIS — R635 Abnormal weight gain: Secondary | ICD-10-CM | POA: Diagnosis not present

## 2022-02-03 DIAGNOSIS — Z6825 Body mass index (BMI) 25.0-25.9, adult: Secondary | ICD-10-CM | POA: Diagnosis not present

## 2022-02-03 DIAGNOSIS — F5081 Binge eating disorder: Secondary | ICD-10-CM | POA: Diagnosis not present

## 2022-02-06 ENCOUNTER — Encounter (HOSPITAL_BASED_OUTPATIENT_CLINIC_OR_DEPARTMENT_OTHER): Payer: Self-pay | Admitting: Pharmacist

## 2022-02-06 ENCOUNTER — Other Ambulatory Visit (HOSPITAL_BASED_OUTPATIENT_CLINIC_OR_DEPARTMENT_OTHER): Payer: Self-pay

## 2022-02-06 MED ORDER — TOPIRAMATE 50 MG PO TABS
50.0000 mg | ORAL_TABLET | Freq: Two times a day (BID) | ORAL | 0 refills | Status: DC
  Filled 2022-02-06: qty 180, 90d supply, fill #0

## 2022-02-06 MED ORDER — VYVANSE 30 MG PO CAPS
ORAL_CAPSULE | ORAL | 0 refills | Status: AC
  Filled 2022-02-06: qty 90, 90d supply, fill #0
  Filled 2022-02-09: qty 30, 30d supply, fill #0

## 2022-02-09 ENCOUNTER — Other Ambulatory Visit (HOSPITAL_BASED_OUTPATIENT_CLINIC_OR_DEPARTMENT_OTHER): Payer: Self-pay

## 2022-02-13 ENCOUNTER — Other Ambulatory Visit (HOSPITAL_BASED_OUTPATIENT_CLINIC_OR_DEPARTMENT_OTHER): Payer: Self-pay

## 2022-03-09 ENCOUNTER — Other Ambulatory Visit (HOSPITAL_BASED_OUTPATIENT_CLINIC_OR_DEPARTMENT_OTHER): Payer: Self-pay

## 2022-03-09 ENCOUNTER — Encounter (HOSPITAL_BASED_OUTPATIENT_CLINIC_OR_DEPARTMENT_OTHER): Payer: Self-pay | Admitting: Pharmacist

## 2022-03-15 ENCOUNTER — Other Ambulatory Visit (HOSPITAL_BASED_OUTPATIENT_CLINIC_OR_DEPARTMENT_OTHER): Payer: Self-pay

## 2022-03-15 MED ORDER — VYVANSE 30 MG PO CAPS
ORAL_CAPSULE | ORAL | 0 refills | Status: AC
  Filled 2022-04-14: qty 30, 30d supply, fill #0

## 2022-03-15 MED ORDER — VYVANSE 30 MG PO CAPS
ORAL_CAPSULE | ORAL | 0 refills | Status: AC
  Filled 2022-03-15: qty 30, 30d supply, fill #0

## 2022-03-16 ENCOUNTER — Other Ambulatory Visit (HOSPITAL_BASED_OUTPATIENT_CLINIC_OR_DEPARTMENT_OTHER): Payer: Self-pay

## 2022-04-09 IMAGING — DX DG CHEST 2V
2 series · 2 of 2 positions shown · non-contrast
Comparison: 11/05/2015

CLINICAL DATA: Cough, congestion

EXAM:
CHEST - 2 VIEW

[chest pa]
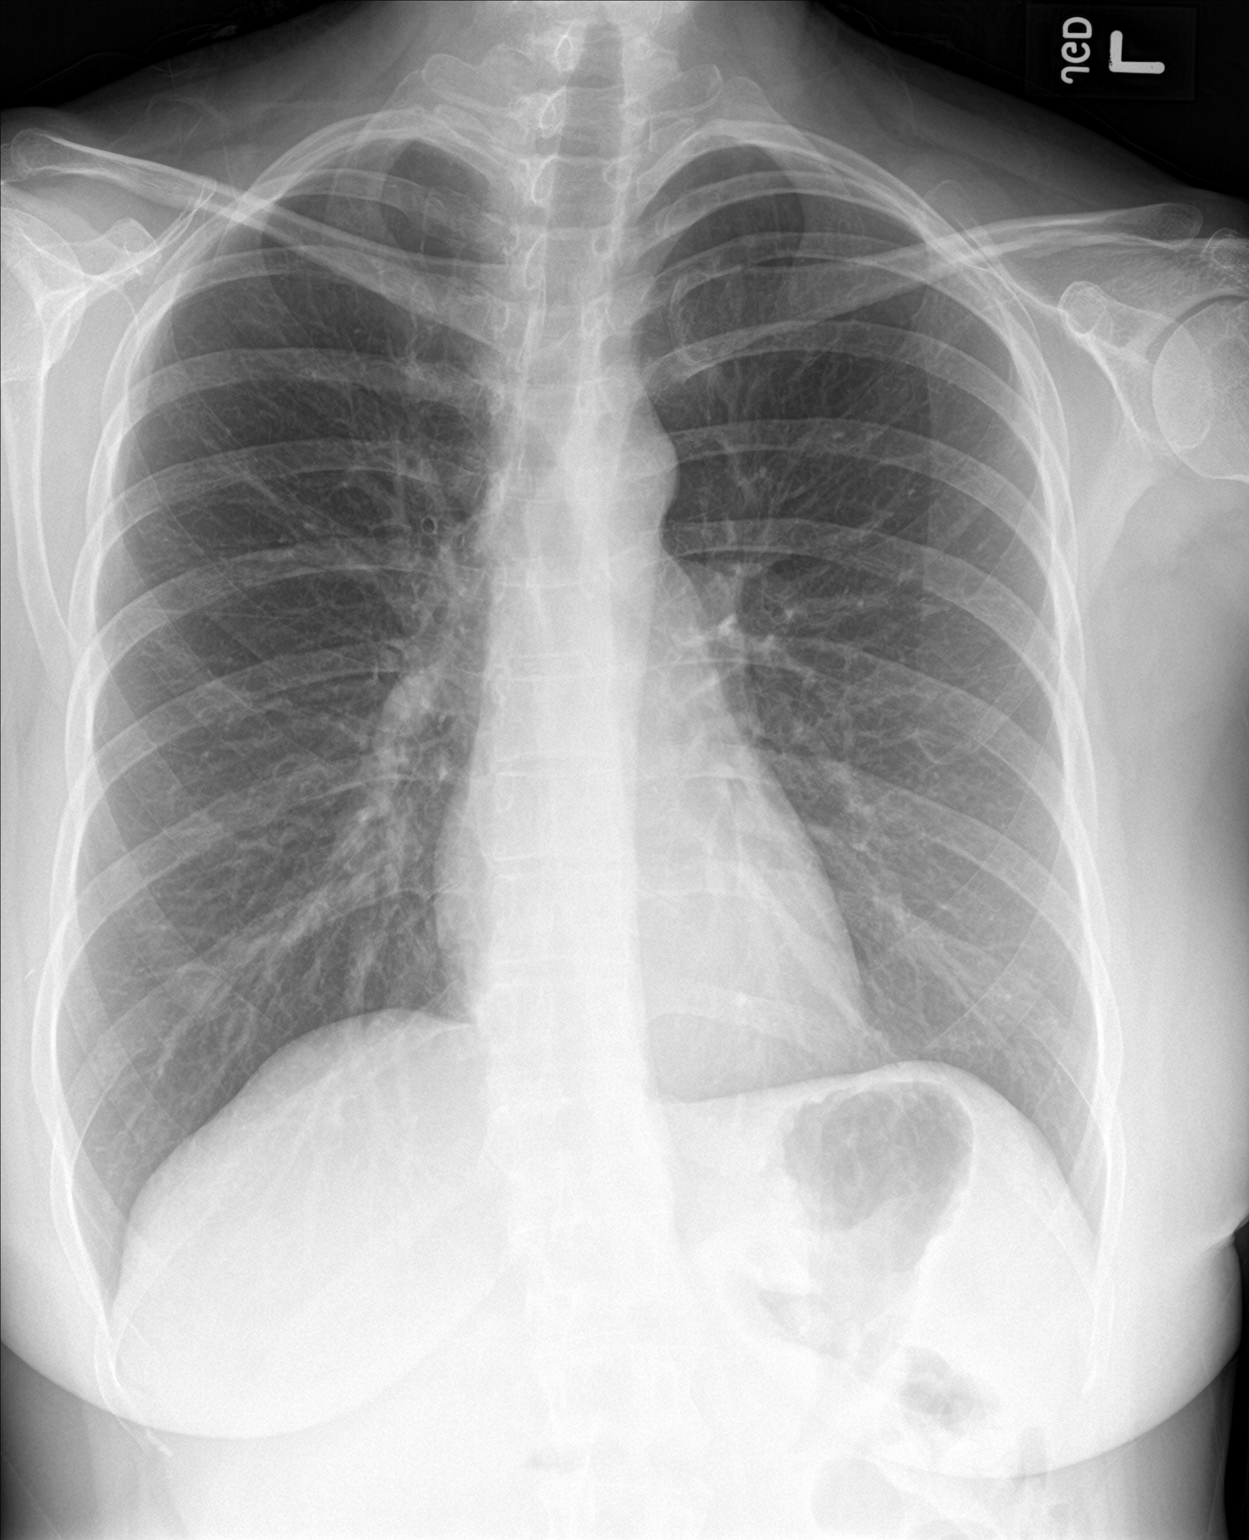

[chest lat]
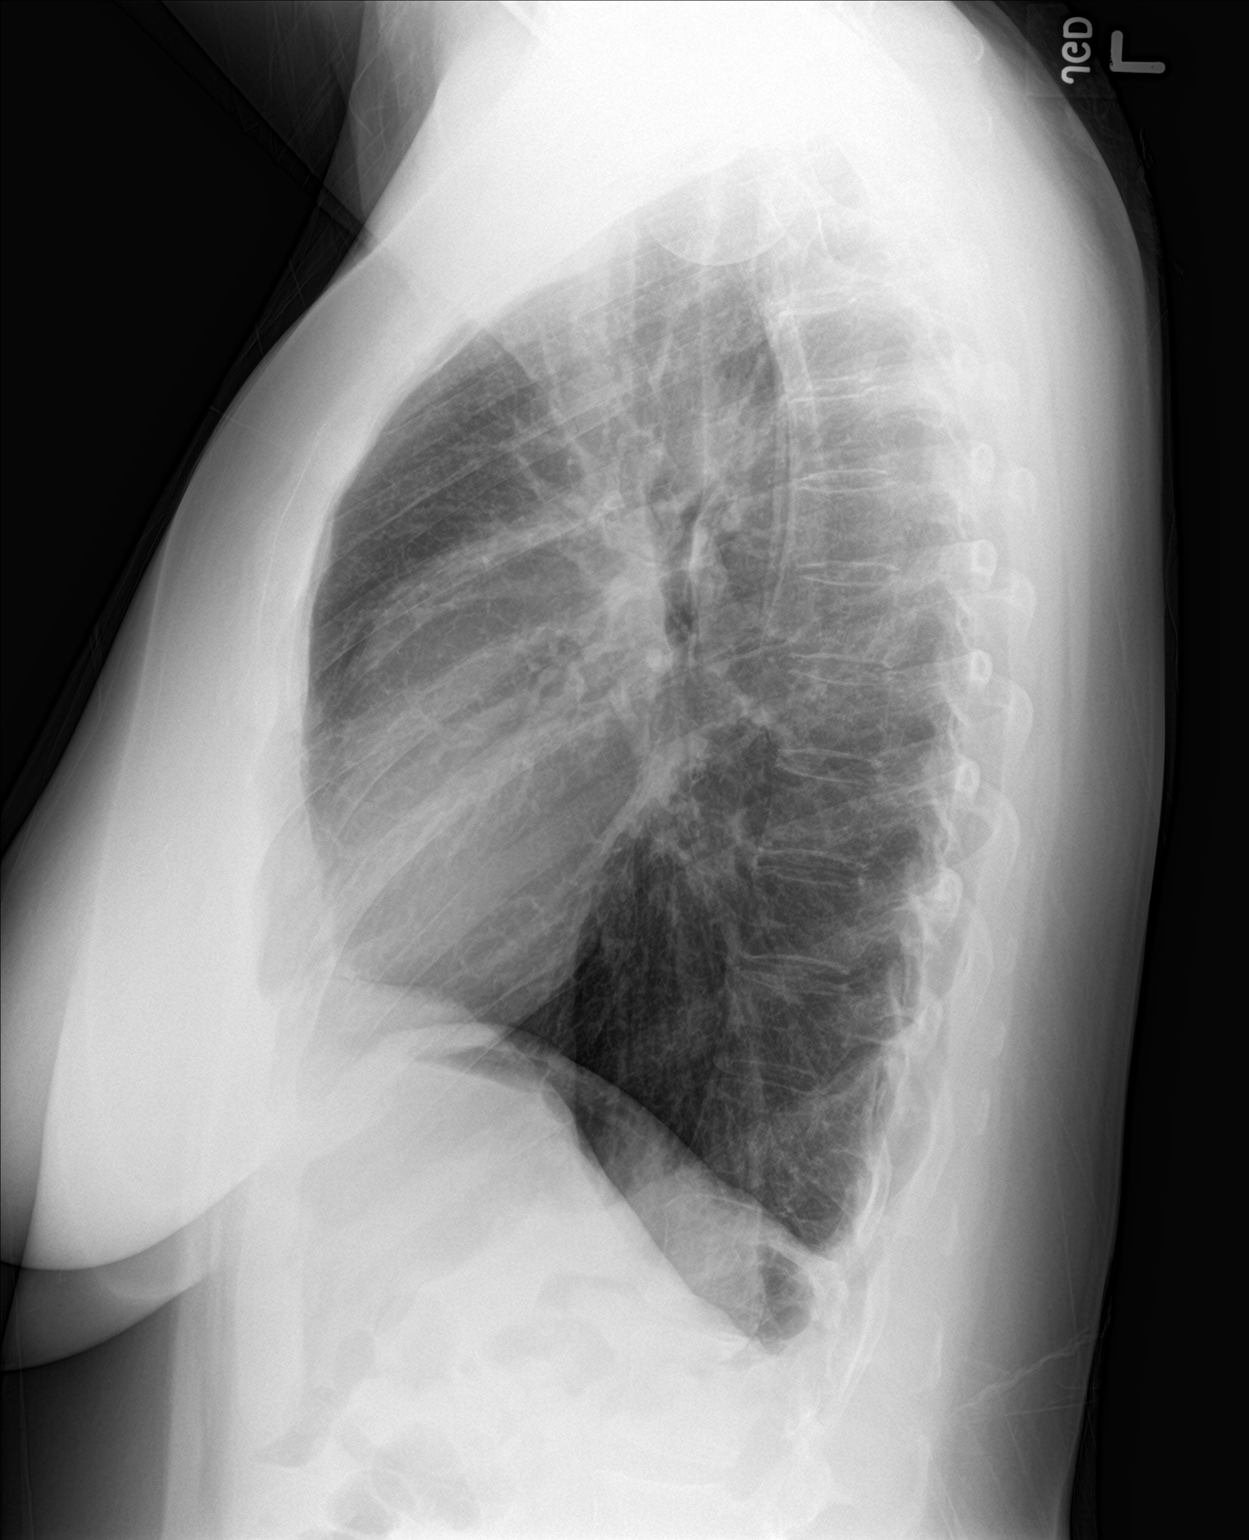

[2 of 2 positions shown; findings below may reference images not displayed]

FINDINGS: The heart size and mediastinal contours are within normal limits.
Both lungs are clear. The visualized skeletal structures are
unremarkable.
IMPRESSION: No acute abnormality of the lungs.

## 2022-04-14 ENCOUNTER — Other Ambulatory Visit (HOSPITAL_BASED_OUTPATIENT_CLINIC_OR_DEPARTMENT_OTHER): Payer: Self-pay

## 2022-04-23 ENCOUNTER — Other Ambulatory Visit (HOSPITAL_BASED_OUTPATIENT_CLINIC_OR_DEPARTMENT_OTHER): Payer: Self-pay

## 2022-04-24 ENCOUNTER — Other Ambulatory Visit (HOSPITAL_BASED_OUTPATIENT_CLINIC_OR_DEPARTMENT_OTHER): Payer: Self-pay

## 2022-04-24 MED ORDER — CITALOPRAM HYDROBROMIDE 20 MG PO TABS
20.0000 mg | ORAL_TABLET | Freq: Every day | ORAL | 0 refills | Status: AC
  Filled 2022-04-24: qty 90, 90d supply, fill #0

## 2022-04-29 ENCOUNTER — Telehealth: Payer: 59 | Admitting: Nurse Practitioner

## 2022-04-29 DIAGNOSIS — L309 Dermatitis, unspecified: Secondary | ICD-10-CM

## 2022-04-29 MED ORDER — PREDNISONE 20 MG PO TABS
40.0000 mg | ORAL_TABLET | Freq: Every day | ORAL | 0 refills | Status: AC
Start: 2022-04-29 — End: 2022-05-04

## 2022-04-29 NOTE — Patient Instructions (Signed)
Rash, Adult  A rash is a change in the color of your skin. A rash can also change the way your skin feels. There are many different conditions and factors that can cause a rash. Follow these instructions at home: The goal of treatment is to stop the itching and keep the rash from spreading. Watch for any changes in your symptoms. Let your doctor know about them. Follow these instructions to help with your condition: Medicine Take or apply over-the-counter and prescription medicines only as told by your doctor. These may include medicines: To treat red or swollen skin (corticosteroid creams). To treat itching. To treat an allergy (oral antihistamines). To treat very bad symptoms (oral corticosteroids).  Skin care Put cool cloths (compresses) on the affected areas. Do not scratch or rub your skin. Avoid covering the rash. Make sure that the rash is exposed to air as much as possible. Managing itching and discomfort Avoid hot showers or baths. These can make itching worse. A cold shower may help. Try taking a bath with: Epsom salts. You can get these at your local pharmacy or grocery store. Follow the instructions on the package. Baking soda. Pour a small amount into the bath as told by your doctor. Colloidal oatmeal. You can get this at your local pharmacy or grocery store. Follow the instructions on the package. Try putting baking soda paste onto your skin. Stir water into baking soda until it gets like a paste. Try putting on a lotion that relieves itchiness (calamine lotion). Keep cool and out of the sun. Sweating and being hot can make itching worse. General instructions  Rest as needed. Drink enough fluid to keep your pee (urine) pale yellow. Wear loose-fitting clothing. Avoid scented soaps, detergents, and perfumes. Use gentle soaps, detergents, perfumes, and other cosmetic products. Avoid anything that causes your rash. Keep a journal to help track what causes your rash. Write  down: What you eat. What cosmetic products you use. What you drink. What you wear. This includes jewelry. Keep all follow-up visits as told by your doctor. This is important. Contact a doctor if: You sweat at night. You lose weight. You pee (urinate) more than normal. You pee less than normal, or you notice that your pee is a darker color than normal. You feel weak. You throw up (vomit). Your skin or the whites of your eyes look yellow (jaundice). Your skin: Tingles. Is numb. Your rash: Does not go away after a few days. Gets worse. You are: More thirsty than normal. More tired than normal. You have: New symptoms. Pain in your belly (abdomen). A fever. Watery poop (diarrhea). Get help right away if: You have a fever and your symptoms suddenly get worse. You start to feel mixed up (confused). You have a very bad headache or a stiff neck. You have very bad joint pains or stiffness. You have jerky movements that you cannot control (seizure). Your rash covers all or most of your body. The rash may or may not be painful. You have blisters that: Are on top of the rash. Grow larger. Grow together. Are painful. Are inside your nose or mouth. You have a rash that: Looks like purple pinprick-sized spots all over your body. Has a "bull's eye" or looks like a target. Is red and painful, causes your skin to peel, and is not from being in the sun too long. Summary A rash is a change in the color of your skin. A rash can also change the way your skin   feels. The goal of treatment is to stop the itching and keep the rash from spreading. Take or apply over-the-counter and prescription medicines only as told by your doctor. Contact a doctor if you have new symptoms or symptoms that get worse. Keep all follow-up visits as told by your doctor. This is important. This information is not intended to replace advice given to you by your health care provider. Make sure you discuss any  questions you have with your health care provider. Document Revised: 09/01/2021 Document Reviewed: 09/01/2021 Elsevier Patient Education  2023 [employer]  

## 2022-04-29 NOTE — Progress Notes (Signed)
Virtual Visit Consent   Tonya Holland, you are scheduled for a virtual visit with Mary-Margaret Hassell Done, FNP, a [facility] provider, today.     Just as with appointments in the office, your consent must be obtained to participate.  Your consent will be active for this visit and any virtual visit you may have with one of our providers in the next 365 days.     If you have a MyChart account, a copy of this consent can be sent to you electronically.  All virtual visits are billed to your insurance company just like a traditional visit in the office.    As this is a virtual visit, video technology does not allow for your provider to perform a traditional examination.  This may limit your provider's ability to fully assess your condition.  If your provider identifies any concerns that need to be evaluated in person or the need to arrange testing (such as labs, EKG, etc.), we will make arrangements to do so.     Although advances in technology are sophisticated, we cannot ensure that it will always work on either your end or our end.  If the connection with a video visit is poor, the visit may have to be switched to a telephone visit.  With either a video or telephone visit, we are not always able to ensure that we have a secure connection.     I need to obtain your verbal consent now.   Are you willing to proceed with your visit today? YES   Tonya Holland has provided verbal consent on 04/29/2022 for a virtual visit (video or telephone).   Mary-Margaret Hassell Done, FNP   Date: 04/29/2022 9:01 AM   Virtual Visit via Video Note   I, Mary-Margaret Hassell Done, connected with Tonya Holland ([id number], 1976-08-17) on 04/29/22 at  9:15 AM EDT by a video-enabled telemedicine application and verified that I am speaking with the correct person using two identifiers.  Location: Patient: Virtual Visit Location Patient: Home Provider: Virtual Visit Location Provider: Mobile   I  discussed the limitations of evaluation and management by telemedicine and the availability of in person appointments. The patient expressed understanding and agreed to proceed.    History of Present Illness: Tonya Holland is a 46 y.o. who identifies as a female who was assigned female at birth, and is being seen today for rash .  HPI: Patient does video c/o rash on her face.  Rash This is a new problem. The current episode started in the past 7 days. The affected locations include the face. The rash is characterized by redness, itchiness and dryness. She was exposed to nothing. Pertinent negatives include no congestion, cough or facial edema. Past treatments include moisturizer. The treatment provided mild relief.   Review of Systems  HENT:  Negative for congestion.   Respiratory:  Negative for cough.   Skin:  Positive for rash.   Problems:  Patient Active Problem List   Diagnosis Date Noted   Headache 12/07/2017   Family history of malignant neoplasm of breast 07/15/2014   Seasonal allergies 05/15/2013   Multiple nevi 05/15/2013   Generalized anxiety disorder 03/01/2012   Hypertension 01/11/2012    Allergies:  Allergies  Allergen Reactions   Ace Inhibitors    Penicillins     childhood   Medications:  Current Outpatient Medications:    albuterol (VENTOLIN HFA) 108 (90 Base) MCG/ACT inhaler, Inhale 2 puffs into the lungs every 4 (four) hours as  needed for wheezing or shortness of breath (cough, shortness of breath or wheezing.)., Disp: 18 g, Rfl: 1   ALPRAZolam (XANAX) 0.5 MG tablet, Take 1 tablet (0.5 mg total) by mouth 2 (two) times daily as needed for anxiety., Disp: 30 tablet, Rfl: 1   ALPRAZolam (XANAX) 0.5 MG tablet, Take 1 (one) Tablet daily as needed for anxiety, Disp: 30 tablet, Rfl: 5   benzonatate (TESSALON) 100 MG capsule, Take 1-2 capsules (100-200 mg total) by mouth 3 (three) times daily as needed for cough., Disp: 60 capsule, Rfl: 0   BLISOVI 24 FE 1-20  MG-MCG(24) tablet, Take 1 tablet by mouth daily., Disp: , Rfl: 12   busPIRone (BUSPAR) 15 MG tablet, Take 1 tablet (15 mg total) by mouth 2 (two) times daily., Disp: 180 tablet, Rfl: 1   busPIRone (BUSPAR) 15 MG tablet, Take 1 and 1/2 (one and a half) tablets by mouth daily, Disp: 135 tablet, Rfl: 3   Calcium Carbonate (CALCI-CHEW PO), Take by mouth., Disp: , Rfl:    cetirizine (ZYRTEC) 10 MG tablet, Take 10 mg by mouth as needed., Disp: , Rfl:    citalopram (CELEXA) 20 MG tablet, Take 1 tablet (20 mg total) by mouth daily., Disp: 90 tablet, Rfl: 1   citalopram (CELEXA) 20 MG tablet, 1 tab by mouth daily, Disp: 90 tablet, Rfl: 0   estradiol (ESTRACE) 0.1 MG/GM vaginal cream, 2 (two) gram to vagina as directed, max daily dose: 2 Gram, Disp: 30 g, Rfl: 2   estradiol (ESTRACE) 0.1 MG/GM vaginal cream, Insert 0.5 g vaginally 2 (two) times a week., Disp: 42.5 g, Rfl: 4   fluticasone (FLONASE) 50 MCG/ACT nasal spray, Place 2 sprays into both nostrils as needed., Disp: 16 g, Rfl: 4   lisdexamfetamine (VYVANSE) 30 MG capsule, Take 1 capsule by mouth once daily., Disp: 30 capsule, Rfl: 0   lisdexamfetamine (VYVANSE) 30 MG capsule, Take 1 capsule by mouth once daily., Disp: 30 capsule, Rfl: 0   lisdexamfetamine (VYVANSE) 30 MG capsule, 1 cap by mouth daily, Disp: 90 capsule, Rfl: 0   lisdexamfetamine (VYVANSE) 30 MG capsule, 1 cap by mouth daily, Disp: 30 capsule, Rfl: 0   lisdexamfetamine (VYVANSE) 30 MG capsule, 1 cap by mouth daily, Disp: 30 capsule, Rfl: 0   losartan (COZAAR) 50 MG tablet, Take 1 tablet (50 mg total) by mouth daily., Disp: 90 tablet, Rfl: 1   losartan (COZAAR) 50 MG tablet, Take 1 tablet by mouth daily, Disp: 90 tablet, Rfl: 3   Melatonin-Pyridoxine (MELATIN PO), Take by mouth daily., Disp: , Rfl:    naproxen (NAPROSYN) 500 MG tablet, Take 1 tablet (500 mg total) by mouth as needed., Disp: 60 tablet, Rfl: 2   ondansetron (ZOFRAN ODT) 4 MG disintegrating tablet, Take 1 tablet (4 mg  total) by mouth every 8 (eight) hours as needed for nausea or vomiting., Disp: 10 tablet, Rfl: 0   phentermine (ADIPEX-P) 37.5 MG tablet, Take 1 tablet by mouth daily, Disp: 30 tablet, Rfl: 0   predniSONE (DELTASONE) 20 MG tablet, Take 2 tablets daily with breakfast., Disp: 10 tablet, Rfl: 0   promethazine-dextromethorphan (PROMETHAZINE-DM) 6.25-15 MG/5ML syrup, Take 5 mLs by mouth at bedtime as needed for cough., Disp: 100 mL, Rfl: 0   spironolactone (ALDACTONE) 50 MG tablet, Take 1 tablet by mouth daily, Disp: 90 tablet, Rfl: 3   SUMAtriptan (IMITREX) 50 MG tablet, Take 1 tablet (50 mg total) by mouth once for 1 dose. May repeat in 2 hours if headache persists or  recurs., Disp: 6 tablet, Rfl: 1   topiramate (TOPAMAX) 25 MG tablet, Take 1 (one) tablet by mouth two times daily, Disp: 180 tablet, Rfl: 1   topiramate (TOPAMAX) 50 MG tablet, Take 1 (one) Tablet by mouth two times daily, Disp: 180 tablet, Rfl: 0   vitamin C (ASCORBIC ACID) 500 MG tablet, Take 500 mg by mouth as needed., Disp: , Rfl:    Vitamin D, Ergocalciferol, (DRISDOL) 1.25 MG (50000 UNIT) CAPS capsule, Take 1 capsule by mouth once weekly., Disp: 4 capsule, Rfl: 3  Observations/Objective: Patient is well-developed, well-nourished in no acute distress.  Resting comfortably  at home.  Head is normocephalic, atraumatic.  No labored breathing.  Speech is clear and coherent with logical content.  Patient is alert and oriented at baseline.  Erythematous diffuse facial rash. Appears dry  Assessment and Plan:  Tonya Holland in today with chief complaint of Rash   1. Facial dermatitis Avoid using hot water on face Cetaphil, cleansers and moisturizer.  Cool compresses as needed If not improving needs to see dermatology or PCP  Meds ordered this encounter  Medications   predniSONE (DELTASONE) 20 MG tablet    Sig: Take 2 tablets (40 mg total) by mouth daily with breakfast for 5 days. 2 po daily for 5 days    Dispense:   10 tablet    Refill:  0    Order Specific Question:   Supervising Provider    Answer:   Noemi Chapel [[id number]]       Follow Up Instructions: I discussed the assessment and treatment plan with the patient. The patient was provided an opportunity to ask questions and all were answered. The patient agreed with the plan and demonstrated an understanding of the instructions.  A copy of instructions were sent to the patient via MyChart.  The patient was advised to call back or seek an in-person evaluation if the symptoms worsen or if the condition fails to improve as anticipated.  Time:  I spent 12 minutes with the patient via telehealth technology discussing the above problems/concerns.    Mary-Margaret Hassell Done, FNP

## 2022-05-05 ENCOUNTER — Other Ambulatory Visit (HOSPITAL_BASED_OUTPATIENT_CLINIC_OR_DEPARTMENT_OTHER): Payer: Self-pay

## 2022-05-05 DIAGNOSIS — N3946 Mixed incontinence: Secondary | ICD-10-CM | POA: Diagnosis not present

## 2022-05-05 DIAGNOSIS — F5081 Binge eating disorder: Secondary | ICD-10-CM | POA: Diagnosis not present

## 2022-05-05 DIAGNOSIS — R635 Abnormal weight gain: Secondary | ICD-10-CM | POA: Diagnosis not present

## 2022-05-05 DIAGNOSIS — R300 Dysuria: Secondary | ICD-10-CM | POA: Diagnosis not present

## 2022-05-05 DIAGNOSIS — Z79899 Other long term (current) drug therapy: Secondary | ICD-10-CM | POA: Diagnosis not present

## 2022-05-05 MED ORDER — ALPRAZOLAM 0.5 MG PO TABS
ORAL_TABLET | ORAL | 1 refills | Status: AC
  Filled 2022-05-05: qty 30, 30d supply, fill #0

## 2022-05-05 MED ORDER — PREDNISONE 10 MG PO TABS
ORAL_TABLET | ORAL | 0 refills | Status: AC
  Filled 2022-05-05: qty 21, 6d supply, fill #0

## 2022-05-05 MED ORDER — TOPIRAMATE 50 MG PO TABS
50.0000 mg | ORAL_TABLET | Freq: Two times a day (BID) | ORAL | 0 refills | Status: DC
  Filled 2022-05-05: qty 180, 90d supply, fill #0

## 2022-05-05 MED ORDER — VYVANSE 30 MG PO CAPS
ORAL_CAPSULE | ORAL | 0 refills | Status: DC
  Filled 2022-05-12: qty 30, 30d supply, fill #0

## 2022-05-12 ENCOUNTER — Other Ambulatory Visit (HOSPITAL_BASED_OUTPATIENT_CLINIC_OR_DEPARTMENT_OTHER): Payer: Self-pay

## 2022-06-13 ENCOUNTER — Encounter: Payer: Self-pay | Admitting: Nurse Practitioner

## 2022-06-13 ENCOUNTER — Other Ambulatory Visit (HOSPITAL_BASED_OUTPATIENT_CLINIC_OR_DEPARTMENT_OTHER): Payer: Self-pay

## 2022-06-15 ENCOUNTER — Other Ambulatory Visit (HOSPITAL_BASED_OUTPATIENT_CLINIC_OR_DEPARTMENT_OTHER): Payer: Self-pay

## 2022-06-15 MED ORDER — VYVANSE 30 MG PO CAPS
ORAL_CAPSULE | ORAL | 0 refills | Status: DC
  Filled 2022-06-15: qty 30, 30d supply, fill #0

## 2022-07-06 ENCOUNTER — Telehealth: Payer: 59 | Admitting: Family Medicine

## 2022-07-06 ENCOUNTER — Other Ambulatory Visit (HOSPITAL_BASED_OUTPATIENT_CLINIC_OR_DEPARTMENT_OTHER): Payer: Self-pay

## 2022-07-06 DIAGNOSIS — R3989 Other symptoms and signs involving the genitourinary system: Secondary | ICD-10-CM | POA: Diagnosis not present

## 2022-07-06 MED ORDER — NITROFURANTOIN MONOHYD MACRO 100 MG PO CAPS
100.0000 mg | ORAL_CAPSULE | Freq: Two times a day (BID) | ORAL | 0 refills | Status: AC
  Filled 2022-07-06: qty 10, 5d supply, fill #0

## 2022-07-06 NOTE — Progress Notes (Signed)

## 2022-07-17 ENCOUNTER — Other Ambulatory Visit (HOSPITAL_BASED_OUTPATIENT_CLINIC_OR_DEPARTMENT_OTHER): Payer: Self-pay

## 2022-07-19 ENCOUNTER — Ambulatory Visit: Payer: 59 | Admitting: Nurse Practitioner

## 2022-07-20 ENCOUNTER — Other Ambulatory Visit (HOSPITAL_BASED_OUTPATIENT_CLINIC_OR_DEPARTMENT_OTHER): Payer: Self-pay

## 2022-07-20 ENCOUNTER — Encounter (HOSPITAL_BASED_OUTPATIENT_CLINIC_OR_DEPARTMENT_OTHER): Payer: Self-pay | Admitting: Pharmacist

## 2022-07-21 ENCOUNTER — Other Ambulatory Visit (HOSPITAL_BASED_OUTPATIENT_CLINIC_OR_DEPARTMENT_OTHER): Payer: Self-pay

## 2022-07-24 ENCOUNTER — Other Ambulatory Visit (HOSPITAL_BASED_OUTPATIENT_CLINIC_OR_DEPARTMENT_OTHER): Payer: Self-pay

## 2022-07-24 MED ORDER — VYVANSE 30 MG PO CAPS
ORAL_CAPSULE | ORAL | 0 refills | Status: AC
  Filled 2022-07-24: qty 30, 30d supply, fill #0

## 2022-08-08 ENCOUNTER — Other Ambulatory Visit (HOSPITAL_BASED_OUTPATIENT_CLINIC_OR_DEPARTMENT_OTHER): Payer: Self-pay

## 2022-08-08 DIAGNOSIS — F5081 Binge eating disorder: Secondary | ICD-10-CM | POA: Diagnosis not present

## 2022-08-08 DIAGNOSIS — R635 Abnormal weight gain: Secondary | ICD-10-CM | POA: Diagnosis not present

## 2022-08-08 DIAGNOSIS — F411 Generalized anxiety disorder: Secondary | ICD-10-CM | POA: Diagnosis not present

## 2022-08-08 DIAGNOSIS — N3946 Mixed incontinence: Secondary | ICD-10-CM | POA: Diagnosis not present

## 2022-08-08 DIAGNOSIS — Z131 Encounter for screening for diabetes mellitus: Secondary | ICD-10-CM | POA: Diagnosis not present

## 2022-08-08 DIAGNOSIS — E7800 Pure hypercholesterolemia, unspecified: Secondary | ICD-10-CM | POA: Diagnosis not present

## 2022-08-08 DIAGNOSIS — Z79899 Other long term (current) drug therapy: Secondary | ICD-10-CM | POA: Diagnosis not present

## 2022-08-08 MED ORDER — ALPRAZOLAM 0.5 MG PO TABS
0.5000 mg | ORAL_TABLET | Freq: Every day | ORAL | 2 refills | Status: AC | PRN
  Filled 2022-08-08: qty 30, 30d supply, fill #0

## 2022-08-08 MED ORDER — LISDEXAMFETAMINE DIMESYLATE 40 MG PO CAPS
40.0000 mg | ORAL_CAPSULE | Freq: Every day | ORAL | 0 refills | Status: AC
  Filled 2022-08-08: qty 30, 30d supply, fill #0

## 2022-08-08 MED ORDER — LISDEXAMFETAMINE DIMESYLATE 40 MG PO CAPS
40.0000 mg | ORAL_CAPSULE | Freq: Every day | ORAL | 0 refills | Status: AC
  Filled 2022-10-18: qty 30, 30d supply, fill #0

## 2022-08-08 MED ORDER — TOPIRAMATE 50 MG PO TABS
50.0000 mg | ORAL_TABLET | Freq: Two times a day (BID) | ORAL | 0 refills | Status: AC
  Filled 2022-08-08 – 2022-09-13 (×2): qty 180, 90d supply, fill #0

## 2022-08-08 MED ORDER — CITALOPRAM HYDROBROMIDE 20 MG PO TABS
20.0000 mg | ORAL_TABLET | Freq: Every day | ORAL | 3 refills | Status: AC
  Filled 2022-08-08: qty 90, 90d supply, fill #0

## 2022-08-08 MED ORDER — LISDEXAMFETAMINE DIMESYLATE 40 MG PO CAPS
40.0000 mg | ORAL_CAPSULE | Freq: Every day | ORAL | 0 refills | Status: AC
Start: 2022-08-08 — End: ?
  Filled 2022-08-08 – 2022-09-13 (×2): qty 30, 30d supply, fill #0

## 2022-08-09 ENCOUNTER — Other Ambulatory Visit (HOSPITAL_BASED_OUTPATIENT_CLINIC_OR_DEPARTMENT_OTHER): Payer: Self-pay

## 2022-09-06 ENCOUNTER — Other Ambulatory Visit (HOSPITAL_BASED_OUTPATIENT_CLINIC_OR_DEPARTMENT_OTHER): Payer: Self-pay

## 2022-09-13 ENCOUNTER — Encounter (HOSPITAL_BASED_OUTPATIENT_CLINIC_OR_DEPARTMENT_OTHER): Payer: Self-pay | Admitting: Pharmacist

## 2022-09-13 ENCOUNTER — Other Ambulatory Visit (HOSPITAL_BASED_OUTPATIENT_CLINIC_OR_DEPARTMENT_OTHER): Payer: Self-pay

## 2022-09-14 ENCOUNTER — Other Ambulatory Visit (HOSPITAL_BASED_OUTPATIENT_CLINIC_OR_DEPARTMENT_OTHER): Payer: Self-pay

## 2022-09-15 ENCOUNTER — Other Ambulatory Visit (HOSPITAL_BASED_OUTPATIENT_CLINIC_OR_DEPARTMENT_OTHER): Payer: Self-pay

## 2022-10-01 ENCOUNTER — Other Ambulatory Visit (HOSPITAL_BASED_OUTPATIENT_CLINIC_OR_DEPARTMENT_OTHER): Payer: Self-pay

## 2022-10-03 ENCOUNTER — Other Ambulatory Visit (HOSPITAL_BASED_OUTPATIENT_CLINIC_OR_DEPARTMENT_OTHER): Payer: Self-pay

## 2022-10-04 ENCOUNTER — Encounter (HOSPITAL_BASED_OUTPATIENT_CLINIC_OR_DEPARTMENT_OTHER): Payer: Self-pay | Admitting: Pharmacist

## 2022-10-04 ENCOUNTER — Other Ambulatory Visit (HOSPITAL_BASED_OUTPATIENT_CLINIC_OR_DEPARTMENT_OTHER): Payer: Self-pay

## 2022-10-18 ENCOUNTER — Other Ambulatory Visit (HOSPITAL_BASED_OUTPATIENT_CLINIC_OR_DEPARTMENT_OTHER): Payer: Self-pay
